# Patient Record
Sex: Female | Born: 1965 | Race: White | Hispanic: No | Marital: Single | Smoking: Never smoker
Health system: Southern US, Community
[De-identification: ages and names within clinical notes are randomized; demographics above are authoritative.]

## PROBLEM LIST (undated history)

## (undated) DIAGNOSIS — E663 Overweight: Secondary | ICD-10-CM

## (undated) DIAGNOSIS — F32A Depression, unspecified: Secondary | ICD-10-CM

## (undated) HISTORY — PX: FRACTURE SURGERY: SHX138

## (undated) HISTORY — DX: Depression, unspecified: F32.A

## (undated) HISTORY — PX: FEMUR FRACTURE SURGERY: SHX633

## (undated) HISTORY — DX: Overweight: E66.3

---

## 2000-08-13 ENCOUNTER — Other Ambulatory Visit: Admission: RE | Admit: 2000-08-13 | Discharge: 2000-08-13 | Payer: Self-pay

## 2002-08-10 ENCOUNTER — Emergency Department (HOSPITAL_COMMUNITY): Admission: EM | Admit: 2002-08-10 | Discharge: 2002-08-10 | Payer: Self-pay | Admitting: Emergency Medicine

## 2003-12-06 ENCOUNTER — Other Ambulatory Visit: Admission: RE | Admit: 2003-12-06 | Discharge: 2003-12-06 | Payer: Self-pay | Admitting: Family Medicine

## 2003-12-13 ENCOUNTER — Encounter: Admission: RE | Admit: 2003-12-13 | Discharge: 2003-12-13 | Payer: Self-pay | Admitting: Family Medicine

## 2004-02-15 ENCOUNTER — Encounter: Admission: RE | Admit: 2004-02-15 | Discharge: 2004-02-15 | Payer: Self-pay | Admitting: Family Medicine

## 2004-12-06 ENCOUNTER — Other Ambulatory Visit: Admission: RE | Admit: 2004-12-06 | Discharge: 2004-12-06 | Payer: Self-pay | Admitting: Family Medicine

## 2004-12-18 ENCOUNTER — Encounter: Admission: RE | Admit: 2004-12-18 | Discharge: 2004-12-18 | Payer: Self-pay | Admitting: Family Medicine

## 2005-04-10 ENCOUNTER — Emergency Department (HOSPITAL_COMMUNITY): Admission: EM | Admit: 2005-04-10 | Discharge: 2005-04-10 | Payer: Self-pay | Admitting: Emergency Medicine

## 2007-08-18 ENCOUNTER — Other Ambulatory Visit: Admission: RE | Admit: 2007-08-18 | Discharge: 2007-08-18 | Payer: Self-pay | Admitting: Family Medicine

## 2009-11-23 ENCOUNTER — Encounter: Admission: RE | Admit: 2009-11-23 | Discharge: 2009-11-23 | Payer: Self-pay | Admitting: Family Medicine

## 2010-10-16 ENCOUNTER — Other Ambulatory Visit
Admission: RE | Admit: 2010-10-16 | Discharge: 2010-10-16 | Payer: Self-pay | Source: Home / Self Care | Admitting: Family Medicine

## 2010-10-29 ENCOUNTER — Encounter: Payer: Self-pay | Admitting: Family Medicine
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
## 2012-07-30 ENCOUNTER — Other Ambulatory Visit: Payer: Self-pay | Admitting: Family Medicine

## 2012-07-30 DIAGNOSIS — Z1231 Encounter for screening mammogram for malignant neoplasm of breast: Secondary | ICD-10-CM

## 2012-09-02 ENCOUNTER — Ambulatory Visit
Admission: RE | Admit: 2012-09-02 | Discharge: 2012-09-02 | Disposition: A | Discharge status: Home / Self Care | Payer: Managed Care, Other (non HMO) | Source: Ambulatory Visit | Attending: Family Medicine | Admitting: Family Medicine

## 2012-09-02 DIAGNOSIS — Z1231 Encounter for screening mammogram for malignant neoplasm of breast: Secondary | ICD-10-CM

## 2012-09-12 ENCOUNTER — Other Ambulatory Visit: Payer: Self-pay | Admitting: Family Medicine

## 2012-09-12 DIAGNOSIS — R928 Other abnormal and inconclusive findings on diagnostic imaging of breast: Secondary | ICD-10-CM

## 2012-09-23 ENCOUNTER — Ambulatory Visit
Admission: RE | Admit: 2012-09-23 | Discharge: 2012-09-23 | Disposition: A | Discharge status: Home / Self Care | Payer: Managed Care, Other (non HMO) | Source: Ambulatory Visit | Attending: Family Medicine | Admitting: Family Medicine

## 2012-09-23 DIAGNOSIS — R928 Other abnormal and inconclusive findings on diagnostic imaging of breast: Secondary | ICD-10-CM

## 2013-07-29 ENCOUNTER — Other Ambulatory Visit: Payer: Self-pay

## 2013-07-29 DIAGNOSIS — Z1231 Encounter for screening mammogram for malignant neoplasm of breast: Secondary | ICD-10-CM

## 2013-09-04 ENCOUNTER — Ambulatory Visit
Admission: RE | Admit: 2013-09-04 | Discharge: 2013-09-04 | Disposition: A | Discharge status: Home / Self Care | Payer: Managed Care, Other (non HMO) | Source: Ambulatory Visit

## 2013-09-04 DIAGNOSIS — Z1231 Encounter for screening mammogram for malignant neoplasm of breast: Secondary | ICD-10-CM

## 2014-08-04 ENCOUNTER — Other Ambulatory Visit: Payer: Self-pay

## 2014-08-04 DIAGNOSIS — Z1231 Encounter for screening mammogram for malignant neoplasm of breast: Secondary | ICD-10-CM

## 2014-09-10 ENCOUNTER — Ambulatory Visit
Admission: RE | Admit: 2014-09-10 | Discharge: 2014-09-10 | Disposition: A | Discharge status: Home / Self Care | Payer: Managed Care, Other (non HMO) | Source: Ambulatory Visit

## 2014-09-10 DIAGNOSIS — Z1231 Encounter for screening mammogram for malignant neoplasm of breast: Secondary | ICD-10-CM

## 2014-09-15 ENCOUNTER — Other Ambulatory Visit: Payer: Self-pay | Admitting: Family Medicine

## 2014-09-15 DIAGNOSIS — R928 Other abnormal and inconclusive findings on diagnostic imaging of breast: Secondary | ICD-10-CM

## 2014-10-06 ENCOUNTER — Ambulatory Visit
Admission: RE | Admit: 2014-10-06 | Discharge: 2014-10-06 | Disposition: A | Discharge status: Home / Self Care | Payer: Managed Care, Other (non HMO) | Source: Ambulatory Visit | Attending: Family Medicine | Admitting: Family Medicine

## 2014-10-06 ENCOUNTER — Encounter (INDEPENDENT_AMBULATORY_CARE_PROVIDER_SITE_OTHER): Payer: Self-pay

## 2014-10-06 DIAGNOSIS — R928 Other abnormal and inconclusive findings on diagnostic imaging of breast: Secondary | ICD-10-CM

## 2015-11-03 ENCOUNTER — Other Ambulatory Visit: Payer: Self-pay

## 2015-11-03 DIAGNOSIS — Z1231 Encounter for screening mammogram for malignant neoplasm of breast: Secondary | ICD-10-CM

## 2015-11-18 ENCOUNTER — Other Ambulatory Visit: Payer: Self-pay | Admitting: Family Medicine

## 2015-11-18 ENCOUNTER — Other Ambulatory Visit (HOSPITAL_COMMUNITY)
Admission: RE | Admit: 2015-11-18 | Discharge: 2015-11-18 | Disposition: A | Discharge status: Home / Self Care | Payer: Managed Care, Other (non HMO) | Source: Ambulatory Visit | Attending: Family Medicine | Admitting: Family Medicine

## 2015-11-18 DIAGNOSIS — Z01419 Encounter for gynecological examination (general) (routine) without abnormal findings: Secondary | ICD-10-CM | POA: Diagnosis present

## 2015-11-18 DIAGNOSIS — Z8041 Family history of malignant neoplasm of ovary: Secondary | ICD-10-CM

## 2015-11-18 DIAGNOSIS — Z1151 Encounter for screening for human papillomavirus (HPV): Secondary | ICD-10-CM | POA: Diagnosis not present

## 2015-11-21 ENCOUNTER — Ambulatory Visit
Admission: RE | Admit: 2015-11-21 | Discharge: 2015-11-21 | Disposition: A | Discharge status: Home / Self Care | Payer: Managed Care, Other (non HMO) | Source: Ambulatory Visit

## 2015-11-21 DIAGNOSIS — Z1231 Encounter for screening mammogram for malignant neoplasm of breast: Secondary | ICD-10-CM

## 2015-11-22 LAB — CYTOLOGY - PAP

## 2016-12-28 ENCOUNTER — Other Ambulatory Visit: Payer: Self-pay | Admitting: Family Medicine

## 2016-12-28 DIAGNOSIS — Z8041 Family history of malignant neoplasm of ovary: Secondary | ICD-10-CM

## 2017-01-15 ENCOUNTER — Ambulatory Visit
Admission: RE | Admit: 2017-01-15 | Discharge: 2017-01-15 | Disposition: A | Discharge status: Home / Self Care | Payer: Managed Care, Other (non HMO) | Source: Ambulatory Visit | Attending: Family Medicine | Admitting: Family Medicine

## 2017-01-15 DIAGNOSIS — Z8041 Family history of malignant neoplasm of ovary: Secondary | ICD-10-CM

## 2018-01-13 ENCOUNTER — Other Ambulatory Visit: Payer: Self-pay | Admitting: Family Medicine

## 2018-01-13 DIAGNOSIS — Z1231 Encounter for screening mammogram for malignant neoplasm of breast: Secondary | ICD-10-CM

## 2018-02-04 ENCOUNTER — Ambulatory Visit
Admission: RE | Admit: 2018-02-04 | Discharge: 2018-02-04 | Disposition: A | Discharge status: Home / Self Care | Payer: Managed Care, Other (non HMO) | Source: Ambulatory Visit | Attending: Family Medicine | Admitting: Family Medicine

## 2018-02-04 DIAGNOSIS — Z1231 Encounter for screening mammogram for malignant neoplasm of breast: Secondary | ICD-10-CM

## 2020-01-07 ENCOUNTER — Ambulatory Visit: Payer: Self-pay | Attending: Internal Medicine

## 2020-01-07 DIAGNOSIS — Z23 Encounter for immunization: Secondary | ICD-10-CM

## 2020-01-07 NOTE — Progress Notes (Signed)
   Covid-19 Vaccination Clinic  Name:  [name]    MRN: [id number] DOB: Nov 02, 1965  01/07/2020  Ms. [name] was observed post Covid-19 immunization for 15 minutes without incident. She was provided with Vaccine Information Sheet and instruction to access the V-Safe system.   Ms. [name] was instructed to call 911 with any severe reactions post vaccine: Marland Kitchen Difficulty breathing  . Swelling of face and throat  . A fast heartbeat  . A bad rash all over body  . Dizziness and weakness   Immunizations Administered    Name Date Dose VIS Date Route   Pfizer COVID-19 Vaccine 01/07/2020  4:05 PM 0.3 mL 09/18/2019 Intramuscular   Manufacturer: [employer], [employer]   Lot: [id number]   NDC: [address]-8

## 2020-02-01 ENCOUNTER — Ambulatory Visit: Payer: Self-pay | Attending: Internal Medicine

## 2020-02-01 DIAGNOSIS — Z23 Encounter for immunization: Secondary | ICD-10-CM

## 2020-02-01 NOTE — Progress Notes (Signed)
   Covid-19 Vaccination Clinic  Name:  [name]    MRN: [id number] DOB: July 03, 1966  02/01/2020  Ms. [name] was observed post Covid-19 immunization for 15 minutes without incident. She was provided with Vaccine Information Sheet and instruction to access the V-Safe system.   Ms. [name] was instructed to call 911 with any severe reactions post vaccine: Marland Kitchen Difficulty breathing  . Swelling of face and throat  . A fast heartbeat  . A bad rash all over body  . Dizziness and weakness   Immunizations Administered    Name Date Dose VIS Date Route   Pfizer COVID-19 Vaccine 02/01/2020 10:37 AM 0.3 mL 12/02/2018 Intramuscular   Manufacturer: [employer], [employer]   Lot: [id number]   NDC: [address]-0

## 2020-08-15 ENCOUNTER — Telehealth (INDEPENDENT_AMBULATORY_CARE_PROVIDER_SITE_OTHER): Payer: Self-pay

## 2020-08-15 ENCOUNTER — Other Ambulatory Visit: Payer: Self-pay

## 2020-08-15 ENCOUNTER — Ambulatory Visit (INDEPENDENT_AMBULATORY_CARE_PROVIDER_SITE_OTHER): Payer: Managed Care, Other (non HMO) | Admitting: Family Medicine

## 2020-08-15 ENCOUNTER — Encounter (INDEPENDENT_AMBULATORY_CARE_PROVIDER_SITE_OTHER): Payer: Self-pay | Admitting: Family Medicine

## 2020-08-15 VITALS — BP 107/74 | HR 90 | Temp 98.2°F | Ht 68.0 in | Wt 201.0 lb

## 2020-08-15 DIAGNOSIS — R5383 Other fatigue: Secondary | ICD-10-CM

## 2020-08-15 DIAGNOSIS — Z8249 Family history of ischemic heart disease and other diseases of the circulatory system: Secondary | ICD-10-CM

## 2020-08-15 DIAGNOSIS — Z9189 Other specified personal risk factors, not elsewhere classified: Secondary | ICD-10-CM

## 2020-08-15 DIAGNOSIS — E669 Obesity, unspecified: Secondary | ICD-10-CM

## 2020-08-15 DIAGNOSIS — Z0289 Encounter for other administrative examinations: Secondary | ICD-10-CM

## 2020-08-15 DIAGNOSIS — R0602 Shortness of breath: Secondary | ICD-10-CM

## 2020-08-15 DIAGNOSIS — F3289 Other specified depressive episodes: Secondary | ICD-10-CM

## 2020-08-15 DIAGNOSIS — Z6830 Body mass index (BMI) 30.0-30.9, adult: Secondary | ICD-10-CM

## 2020-08-15 DIAGNOSIS — E66811 Obesity, class 1: Secondary | ICD-10-CM

## 2020-08-16 LAB — COMPREHENSIVE METABOLIC PANEL
ALT: 12 IU/L (ref 0–32)
AST: 16 IU/L (ref 0–40)
Albumin/Globulin Ratio: 1.6 (ref 1.2–2.2)
Albumin: 4.4 g/dL (ref 3.8–4.9)
Alkaline Phosphatase: 88 IU/L (ref 44–121)
BUN/Creatinine Ratio: 17 (ref 9–23)
BUN: 15 mg/dL (ref 6–24)
Bilirubin Total: 0.4 mg/dL (ref 0.0–1.2)
CO2: 24 mmol/L (ref 20–29)
Calcium: 9.4 mg/dL (ref 8.7–10.2)
Chloride: 104 mmol/L (ref 96–106)
Creatinine, Ser: 0.88 mg/dL (ref 0.57–1.00)
GFR calc Af Amer: 86 mL/min/{1.73_m2} (ref >59)
GFR calc non Af Amer: 74 mL/min/{1.73_m2} (ref >59)
Globulin, Total: 2.7 g/dL (ref 1.5–4.5)
Glucose: 96 mg/dL (ref 65–99)
Potassium: 4.3 mmol/L (ref 3.5–5.2)
Sodium: 139 mmol/L (ref 134–144)
Total Protein: 7.1 g/dL (ref 6.0–8.5)

## 2020-08-16 LAB — CBC WITH DIFFERENTIAL/PLATELET
Basophils Absolute: 0 10*3/uL (ref 0.0–0.2)
Basos: 1 %
EOS (ABSOLUTE): 0 10*3/uL (ref 0.0–0.4)
Eos: 0 %
Hematocrit: 43.9 % (ref 34.0–46.6)
Hemoglobin: 14.5 g/dL (ref 11.1–15.9)
Immature Grans (Abs): 0 10*3/uL (ref 0.0–0.1)
Immature Granulocytes: 0 %
Lymphocytes Absolute: 1.6 10*3/uL (ref 0.7–3.1)
Lymphs: 35 %
MCH: 30.5 pg (ref 26.6–33.0)
MCHC: 33 g/dL (ref 31.5–35.7)
MCV: 92 fL (ref 79–97)
Monocytes Absolute: 0.2 10*3/uL (ref 0.1–0.9)
Monocytes: 5 %
Neutrophils Absolute: 2.7 10*3/uL (ref 1.4–7.0)
Neutrophils: 59 %
Platelets: 219 10*3/uL (ref 150–450)
RBC: 4.76 x10E6/uL (ref 3.77–5.28)
RDW: 12.4 % (ref 11.7–15.4)
WBC: 4.7 10*3/uL (ref 3.4–10.8)

## 2020-08-16 LAB — LIPID PANEL WITH LDL/HDL RATIO
Cholesterol, Total: 187 mg/dL (ref 100–199)
HDL: 54 mg/dL (ref >39)
LDL Chol Calc (NIH): 114 mg/dL — ABNORMAL HIGH (ref 0–99)
LDL/HDL Ratio: 2.1 ratio (ref 0.0–3.2)
Triglycerides: 107 mg/dL (ref 0–149)
VLDL Cholesterol Cal: 19 mg/dL (ref 5–40)

## 2020-08-16 LAB — T4: T4, Total: 5.8 ug/dL (ref 4.5–12.0)

## 2020-08-16 LAB — VITAMIN B12: Vitamin B-12: 245 pg/mL (ref 232–1245)

## 2020-08-16 LAB — TSH: TSH: 1.17 u[IU]/mL (ref 0.450–4.500)

## 2020-08-16 LAB — VITAMIN D 25 HYDROXY (VIT D DEFICIENCY, FRACTURES): Vit D, 25-Hydroxy: 22.2 ng/mL — ABNORMAL LOW (ref 30.0–100.0)

## 2020-08-16 LAB — T3: T3, Total: 94 ng/dL (ref 71–180)

## 2020-08-16 LAB — INSULIN, RANDOM: INSULIN: 9.3 u[IU]/mL (ref 2.6–24.9)

## 2020-08-16 LAB — FOLATE: Folate: 4.4 ng/mL (ref >3.0)

## 2020-08-16 LAB — HEMOGLOBIN A1C
Est. average glucose Bld gHb Est-mCnc: 114 mg/dL
Hgb A1c MFr Bld: 5.6 % (ref 4.8–5.6)

## 2020-08-22 NOTE — Progress Notes (Signed)
Dear Tina Hazel, MD,   Thank you for referring [name] to our clinic. The following note includes my evaluation and treatment recommendations.  Chief Complaint:   OBESITY [name] (MR# [id number]) is a 54 y.o. female who presents for evaluation and treatment of obesity and related comorbidities. Current BMI is Body mass index is 30.56 kg/m. [name] has been struggling with her weight for many years and has been unsuccessful in either losing weight, maintaining weight loss, or reaching her healthy weight goal.  [name] is currently in the action stage of change and ready to dedicate time achieving and maintaining a healthier weight. [name] is interested in becoming our patient and working on intensive lifestyle modifications including (but not limited to) diet and exercise for weight loss.  [name]'s habits were reviewed today and are as follows: Her family eats meals together, she thinks her family will eat healthier with her, her desired weight loss is 26 lbs, she started gaining weight this year, her heaviest weight ever was 201 pounds, she has significant food cravings issues, she snacks frequently in the evenings, she skips meals frequently and she struggles with emotional eating.  Depression Screen [name]'s Food and Mood (modified PHQ-9) score was 2.  Depression screen [name] 2/9 08/15/2020  Decreased Interest 0  Down, Depressed, Hopeless 0  PHQ - 2 Score 0  Altered sleeping 0  Tired, decreased energy 1  Change in appetite 1  Feeling bad or failure about yourself  0  Trouble concentrating 0  Moving slowly or fidgety/restless 0  Suicidal thoughts 0  PHQ-9 Score 2  Difficult doing work/chores Not difficult at all   Subjective:   1. Other fatigue [name] admits to daytime somnolence and admits to waking up still tired. Patent has a history of symptoms of daytime fatigue. [name] generally gets 6 or 7 hours of sleep per night, and states that  she has generally restful sleep. Snoring is present. Apneic episodes are not present. Epworth Sleepiness Score is 5. EKG-ventricular trigeminy, no previous EKG to compare to.  2. SOB (shortness of breath) on exertion [name] notes increasing shortness of breath with exercising and seems to be worsening over time with weight gain. She notes getting out of breath sooner with activity than she used to. This has not gotten worse recently. [name] denies shortness of breath at rest or orthopnea.  3. Family history of coronary artery disease [name] has a family history of coronary artery disease. Her father died of myocardial infarction in his 17's.  4. Other depression [name] is on Effexor 75 mg PO daily. She denies suicidal or homicidal ideas. Her blood pressure is well controlled.  5. At risk for heart disease [name] is at a higher than average risk for cardiovascular disease due to obesity.   Assessment/Plan:   1. Other fatigue [name] does feel that her weight is causing her energy to be lower than it should be. Fatigue may be related to obesity, depression or many other causes. Labs will be ordered, and in the meanwhile, [name] will focus on self care including making healthy food choices, increasing physical activity and focusing on stress reduction.  - EKG 12-Lead - Vitamin B12 - CBC with Differential/Platelet - Comprehensive metabolic panel - Folate - Hemoglobin A1c - Insulin, random - Lipid Panel With LDL/HDL Ratio - T3 - T4 - TSH - Microalbumin / creatinine urine ratio - VITAMIN D 25 Hydroxy (Vit-D Deficiency, Fractures) - Ambulatory referral to Cardiology  2. SOB (shortness of breath)  on exertion [name] does feel that she gets out of breath more easily that she used to when she exercises. [name]'s shortness of breath appears to be obesity related and exercise induced. She has agreed to work on weight loss and gradually increase exercise to treat her  exercise induced shortness of breath. Will continue to monitor closely.  - Vitamin B12 - CBC with Differential/Platelet - Comprehensive metabolic panel - Folate - Hemoglobin A1c - Insulin, random - Lipid Panel With LDL/HDL Ratio - T3 - T4 - TSH - Microalbumin / creatinine urine ratio - VITAMIN D 25 Hydroxy (Vit-D Deficiency, Fractures) - Ambulatory referral to Cardiology  3. Family history of coronary artery disease We will refer to Cardiology for evaluation. [name] will follow up as directed.  - Ambulatory referral to Cardiology  4. Other depression Behavior modification techniques were discussed today to help [name] deal with her emotional/non-hunger eating behaviors. She is to follow up with her primary care physician. Orders and follow up as documented in patient record.   5. At risk for heart disease [name] was given approximately 15 minutes of coronary artery disease prevention counseling today. She is 54 y.o. female and has risk factors for heart disease including obesity. We discussed intensive lifestyle modifications today with an emphasis on specific weight loss instructions and strategies.   Repetitive spaced learning was employed today to elicit superior memory formation and behavioral change.  6. Class 1 obesity with serious comorbidity and body mass index (BMI) of 30.0 to 30.9 in adult, unspecified obesity type [name] is currently in the action stage of change and her goal is to continue with weight loss efforts. I recommend [name] begin the structured treatment plan as follows:  She has agreed to the Category 2 Plan + 100 calories.  Exercise goals: No exercise has been prescribed at this time.   Behavioral modification strategies: increasing lean protein intake, meal planning and cooking strategies, keeping healthy foods in the home and planning for success.  She was informed of the importance of frequent follow-up visits to maximize her success  with intensive lifestyle modifications for her multiple health conditions. She was informed we would discuss her lab results at her next visit unless there is a critical issue that needs to be addressed sooner. [name] agreed to keep her next visit at the agreed upon time to discuss these results.  Objective:   Blood pressure 107/74, pulse 90, temperature 98.2 F (36.8 C), temperature source Oral, height 5\' 8"  (1.727 m), weight 201 lb (91.2 kg), SpO2 99 %. Body mass index is 30.56 kg/m.  EKG: Normal sinus rhythm, rate 88 BPM.  Indirect Calorimeter completed today shows a VO2 of 222 and a REE of 1546.  Her calculated basal metabolic rate is thus her basal metabolic rate is worse than expected.  General: Cooperative, alert, well developed, in no acute distress. HEENT: Conjunctivae and lids unremarkable. Cardiovascular: Regular rhythm.  Lungs: Normal work of breathing. Neurologic: No focal deficits.   Lab Results  Component Value Date   CREATININE 0.88 08/15/2020   BUN 15 08/15/2020   NA 139 08/15/2020   K 4.3 08/15/2020   CL 104 08/15/2020   CO2 24 08/15/2020   Lab Results  Component Value Date   ALT 12 08/15/2020   AST 16 08/15/2020   ALKPHOS 88 08/15/2020   BILITOT 0.4 08/15/2020   Lab Results  Component Value Date   HGBA1C 5.6 08/15/2020   Lab Results  Component Value Date   INSULIN 9.3  08/15/2020   Lab Results  Component Value Date   TSH 1.170 08/15/2020   Lab Results  Component Value Date   CHOL 187 08/15/2020   HDL 54 08/15/2020   LDLCALC 114 (H) 08/15/2020   TRIG 107 08/15/2020   Lab Results  Component Value Date   WBC 4.7 08/15/2020   HGB 14.5 08/15/2020   HCT 43.9 08/15/2020   MCV 92 08/15/2020   PLT 219 08/15/2020   No results found for: IRON, TIBC, FERRITIN  Attestation Statements:   Reviewed by clinician on day of visit: allergies, medications, problem list, medical history, surgical history, family history, social history, and  previous encounter notes.   I, Burt Knack, am acting as transcriptionist for Reuben Likes, MD.  This is the patient's first visit at [facility]. The patient's NEW PATIENT PACKET was reviewed at length. Included in the packet: current and past health history, medications, allergies, ROS, gynecologic history (women only), surgical history, family history, social history, weight history, weight loss surgery history (for those that have had weight loss surgery), nutritional evaluation, mood and food questionnaire, PHQ9, Epworth questionnaire, sleep habits questionnaire, patient life and health improvement goals questionnaire. These will all be scanned into the patient's chart under media.   During the visit, I independently reviewed the patient's EKG, bioimpedance scale results, and indirect calorimeter results. I used this information to tailor a meal plan for the patient that will help her to lose weight and will improve her obesity-related conditions going forward. I performed a medically necessary appropriate examination and/or evaluation. I discussed the assessment and treatment plan with the patient. The patient was provided an opportunity to ask questions and all were answered. The patient agreed with the plan and demonstrated an understanding of the instructions. Labs were ordered at this visit and will be reviewed at the next visit unless more critical results need to be addressed immediately. Clinical information was updated and documented in the EMR.   Time spent on visit including pre-visit chart review and post-visit care was 50 minutes.   A separate 15 minutes was spent on risk counseling (see above).  I have reviewed the above documentation for accuracy and completeness, and I agree with the above. - Katherina Mires, MD

## 2020-08-29 ENCOUNTER — Ambulatory Visit (INDEPENDENT_AMBULATORY_CARE_PROVIDER_SITE_OTHER): Payer: Managed Care, Other (non HMO) | Admitting: Family Medicine

## 2020-08-29 ENCOUNTER — Other Ambulatory Visit: Payer: Self-pay

## 2020-08-29 ENCOUNTER — Encounter (INDEPENDENT_AMBULATORY_CARE_PROVIDER_SITE_OTHER): Payer: Self-pay | Admitting: Family Medicine

## 2020-08-29 VITALS — BP 119/74 | HR 75 | Temp 97.7°F | Ht 68.0 in | Wt 196.0 lb

## 2020-08-29 DIAGNOSIS — Z9189 Other specified personal risk factors, not elsewhere classified: Secondary | ICD-10-CM | POA: Diagnosis not present

## 2020-08-29 DIAGNOSIS — E559 Vitamin D deficiency, unspecified: Secondary | ICD-10-CM

## 2020-08-29 DIAGNOSIS — E7849 Other hyperlipidemia: Secondary | ICD-10-CM | POA: Diagnosis not present

## 2020-08-29 DIAGNOSIS — E8881 Metabolic syndrome: Secondary | ICD-10-CM | POA: Diagnosis not present

## 2020-08-29 DIAGNOSIS — E669 Obesity, unspecified: Secondary | ICD-10-CM | POA: Diagnosis not present

## 2020-08-29 DIAGNOSIS — Z6830 Body mass index (BMI) 30.0-30.9, adult: Secondary | ICD-10-CM

## 2020-08-29 MED ORDER — VITAMIN D (ERGOCALCIFEROL) 1.25 MG (50000 UNIT) PO CAPS: 50000.0000 [IU] | ORAL_CAPSULE | ORAL | 0 refills | Status: DC

## 2020-08-30 NOTE — Progress Notes (Signed)
Chief Complaint:   OBESITY [name] is here to discuss her progress with her obesity treatment plan along with follow-up of her obesity related diagnoses. [name] is on the Category 2 Plan + 100 calories and states she is following her eating plan approximately 95% of the time. [name] states she is doing 0 minutes 0 times per week.  Today's visit was #: 2 Starting weight: 201 lbs Starting date: 08/15/2020 Today's weight: 196 lbs Today's date: 08/29/2020 Total lbs lost to date: 5 Total lbs lost since last in-office visit: 5  Interim History: [name] felt the first 2 weeks wasn't too bad. She enjoys all Thanksgiving sides and food. She voices that for breakfast she was doing boiled eggs, and coffee.lunch was [place] or roast beef sandwich and apple for snack, dinner was salad with chicken on it or salmon with vegetables. She wasn't weighing protein at dinner. Snack was mini ice cream cone. Eating out for events to likely be the hardest obstacle in the next few weeks.  Subjective:   1. Other hyperlipidemia [name]'s last LDL was 114, HDL 54, and triglycerides 035. She is not on statin. Her 10 year ASCVD risk factor is 1.6%.   2. Vitamin D deficiency [name] denies nausea, vomiting, or muscle weakness, but she notes fatigue. She is not on Vit D supplementation.  3. Insulin resistance [name]'s last A1c was 5.6 and insulin 9.3. She has no prior labs.  4. At risk for diabetes mellitus [name] is at higher than average risk for developing diabetes due to obesity.   Assessment/Plan:   1. Other hyperlipidemia Cardiovascular risk and specific lipid/LDL goals reviewed. Lifestyle changes were encouraged. [name] will continue to work on diet, exercise and weight loss efforts. We will repeat labs in 3 months. Orders and follow up as documented in patient record.   Counseling Intensive lifestyle modifications are the first line treatment for this issue. . Dietary  changes: Increase soluble fiber. Decrease simple carbohydrates. . Exercise changes: Moderate to vigorous-intensity aerobic activity 150 minutes per week if tolerated. . Lipid-lowering medications: see documented in medical record.  2. Vitamin D deficiency Low Vitamin D level contributes to fatigue and are associated with obesity, breast, and colon cancer. Levon agreed to start prescription Vitamin D 50,000 IU every week with no refills. We will repeat labs in 3 months. She will follow-up for routine testing of Vitamin D, at least 2-3 times per year to avoid over-replacement.  - Vitamin D, Ergocalciferol, (DRISDOL) 1.25 MG (50000 UNIT) CAPS capsule; Take 1 capsule (50,000 Units total) by mouth every 7 (seven) days.  Dispense: 4 capsule; Refill: 0  3. Insulin resistance [name] will continue to work on weight loss, exercise, and decreasing simple carbohydrates to help decrease the risk of diabetes. We will repeat labs in 3 months, no medications at this time. [name] agreed to follow-up with [name] as directed to closely monitor her progress.  4. At risk for diabetes mellitus [name] was given approximately 30 minutes of diabetes education and counseling today. We discussed intensive lifestyle modifications today with an emphasis on weight loss as well as increasing exercise and decreasing simple carbohydrates in her diet. We also reviewed medication options with an emphasis on risk versus benefit of those discussed.   Repetitive spaced learning was employed today to elicit superior memory formation and behavioral change.  5. Class 1 obesity with serious comorbidity and body mass index (BMI) of 30.0 to 30.9 in adult, unspecified obesity type [name] is currently in the action  stage of change. As such, her goal is to continue with weight loss efforts. She has agreed to the Category 2 Plan.   Exercise goals: No exercise has been prescribed at this time.  Behavioral modification strategies:  increasing lean protein intake, meal planning and cooking strategies and holiday eating strategies .  [name] has agreed to follow-up with our clinic in 2 weeks. She was informed of the importance of frequent follow-up visits to maximize her success with intensive lifestyle modifications for her multiple health conditions.   Objective:   Blood pressure 119/74, pulse 75, temperature 97.7 F (36.5 C), temperature source Oral, height 5\' 8"  (1.727 m), weight 196 lb (88.9 kg), SpO2 98 %. Body mass index is 29.8 kg/m.  General: Cooperative, alert, well developed, in no acute distress. HEENT: Conjunctivae and lids unremarkable. Cardiovascular: Regular rhythm.  Lungs: Normal work of breathing. Neurologic: No focal deficits.   Lab Results  Component Value Date   CREATININE 0.88 08/15/2020   BUN 15 08/15/2020   NA 139 08/15/2020   K 4.3 08/15/2020   CL 104 08/15/2020   CO2 24 08/15/2020   Lab Results  Component Value Date   ALT 12 08/15/2020   AST 16 08/15/2020   ALKPHOS 88 08/15/2020   BILITOT 0.4 08/15/2020   Lab Results  Component Value Date   HGBA1C 5.6 08/15/2020   Lab Results  Component Value Date   INSULIN 9.3 08/15/2020   Lab Results  Component Value Date   TSH 1.170 08/15/2020   Lab Results  Component Value Date   CHOL 187 08/15/2020   HDL 54 08/15/2020   LDLCALC 114 (H) 08/15/2020   TRIG 107 08/15/2020   Lab Results  Component Value Date   WBC 4.7 08/15/2020   HGB 14.5 08/15/2020   HCT 43.9 08/15/2020   MCV 92 08/15/2020   PLT 219 08/15/2020   No results found for: IRON, TIBC, FERRITIN  Attestation Statements:   Reviewed by clinician on day of visit: allergies, medications, problem list, medical history, surgical history, family history, social history, and previous encounter notes.   I, 13/05/2020, am acting as transcriptionist for Burt Knack, MD.  I have reviewed the above documentation for accuracy and completeness, and I agree with  the above. - Reuben Likes, MD

## 2020-09-06 ENCOUNTER — Encounter: Payer: Self-pay | Admitting: *Deleted

## 2020-09-06 ENCOUNTER — Encounter: Payer: Self-pay | Admitting: Internal Medicine

## 2020-09-06 ENCOUNTER — Ambulatory Visit: Payer: Managed Care, Other (non HMO) | Admitting: Internal Medicine

## 2020-09-06 ENCOUNTER — Other Ambulatory Visit: Payer: Self-pay

## 2020-09-06 VITALS — BP 104/76 | HR 68 | Ht 68.0 in | Wt 203.0 lb

## 2020-09-06 DIAGNOSIS — I493 Ventricular premature depolarization: Secondary | ICD-10-CM | POA: Diagnosis not present

## 2020-09-06 DIAGNOSIS — E785 Hyperlipidemia, unspecified: Secondary | ICD-10-CM | POA: Diagnosis not present
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
## 2020-09-06 NOTE — Patient Instructions (Signed)
Medication Instructions:  Your physician recommends that you continue on your current medications as directed. Please refer to the Current Medication list given to you today.  *If you need a refill on your cardiac medications before your next appointment, please call your pharmacy*   Lab Work: None  If you have labs (blood work) drawn today and your tests are completely normal, you will receive your results only by: Marland Kitchen MyChart Message (if you have MyChart) OR . A paper copy in the mail If you have any lab test that is abnormal or we need to change your treatment, we will call you to review the results.   Testing/Procedures: Your physician has requested that you have an echocardiogram. Echocardiography is a painless test that uses sound waves to create images of your heart. It provides your doctor with information about the size and shape of your heart and how well your heart's chambers and valves are working. This procedure takes approximately one hour. There are no restrictions for this procedure.  Your physician has recommended that you wear a 14 day monitor. These monitors are medical devices that record the heart's electrical activity. Doctors most often use these monitors to diagnose arrhythmias. Arrhythmias are problems with the speed or rhythm of the heartbeat. The monitor is a small, portable device. You can wear one while you do your normal daily activities. This is usually used to diagnose what is causing palpitations/syncope (passing out).  Follow-Up: At [facility], you and your health needs are our priority.  As part of our continuing mission to provide you with exceptional heart care, we have created designated Provider Care Teams.  These Care Teams include your primary Cardiologist (physician) and Advanced Practice Providers (APPs -  Physician Assistants and Nurse Practitioners) who all work together to provide you with the care you need, when you need it.  We recommend  signing up for the patient portal called "MyChart".  Sign up information is provided on this After Visit Summary.  MyChart is used to connect with patients for Virtual Visits (Telemedicine).  Patients are able to view lab/test results, encounter notes, upcoming appointments, etc.  Non-urgent messages can be sent to your provider as well.   To learn more about what you can do with MyChart, go to [email or web address].    Your next appointment:   3 month(s)  The format for your next appointment:   In Person  Provider:   Riley Lam, MD   Other Instructions Christena Deem- Long Term Monitor Instructions   Your physician has requested you wear your ZIO patch monitor 14 days.   This is a single patch monitor.  [employer] supplies one patch monitor per enrollment.  Additional stickers are not available.   Please do not apply patch if you will be having a Nuclear Stress Test, Echocardiogram, Cardiac CT, MRI, or Chest Xray during the time frame you would be wearing the monitor. The patch cannot be worn during these tests.  You cannot remove and re-apply the ZIO XT patch monitor.   Your ZIO patch monitor will be sent USPS Priority mail from [facility] directly to your home address. The monitor may also be mailed to a PO BOX if home delivery is not available.   It may take 3-5 days to receive your monitor after you have been enrolled.   Once you have received you monitor, please review enclosed instructions.  Your monitor has already been registered assigning a specific monitor serial # to you.   Applying  the monitor   Shave hair from upper left chest.   Hold abrader disc by orange tab.  Rub abrader in 40 strokes over left upper chest as indicated in your monitor instructions.   Clean area with 4 enclosed alcohol pads .  Use all pads to assure are is cleaned thoroughly.  Let dry.   Apply patch as indicated in monitor instructions.  Patch will be place under collarbone on left side  of chest with arrow pointing upward.   Rub patch adhesive wings for 2 minutes.Remove white label marked "1".  Remove white label marked "2".  Rub patch adhesive wings for 2 additional minutes.   While looking in a mirror, press and release button in center of patch.  A small green light will flash 3-4 times .  This will be your only indicator the monitor has been turned on.     Do not shower for the first 24 hours.  You may shower after the first 24 hours.   Press button if you feel a symptom. You will hear a small click.  Record Date, Time and Symptom in the Patient Log Book.   When you are ready to remove patch, follow instructions on last 2 pages of Patient Log Book.  Stick patch monitor onto last page of Patient Log Book.   Place Patient Log Book in [place] box.  Use locking tab on box and tape box closed securely.  The [employer] and [employer] has [employer] on it.  Please place in mailbox as soon as possible.  Your physician should have your test results approximately 7 days after the monitor has been mailed back to [facility].   Call [facility] Customer Care at [phone] if you have questions regarding your ZIO XT patch monitor.  Call them immediately if you see an orange light blinking on your monitor.   If your monitor falls off in less than 4 days contact our Monitor department at [phone].  If your monitor becomes loose or falls off after 4 days call [employer] at [phone] for suggestions on securing your monitor.

## 2020-09-06 NOTE — Progress Notes (Signed)
Patient ID: [name], female   DOB: [birth year], 54 y.o.   MRN: [id number] Patient enrolled for [employer] to ship a 14 day ZIO XT long term holter monitor to her home.

## 2020-09-10 ENCOUNTER — Ambulatory Visit (INDEPENDENT_AMBULATORY_CARE_PROVIDER_SITE_OTHER): Payer: Managed Care, Other (non HMO)

## 2020-09-10 DIAGNOSIS — I493 Ventricular premature depolarization: Secondary | ICD-10-CM | POA: Diagnosis not present

## 2020-09-13 ENCOUNTER — Ambulatory Visit (INDEPENDENT_AMBULATORY_CARE_PROVIDER_SITE_OTHER): Payer: Managed Care, Other (non HMO) | Admitting: Adult Health

## 2020-09-13 ENCOUNTER — Other Ambulatory Visit: Payer: Self-pay

## 2020-09-13 ENCOUNTER — Encounter (INDEPENDENT_AMBULATORY_CARE_PROVIDER_SITE_OTHER): Payer: Self-pay | Admitting: Adult Health

## 2020-09-13 VITALS — BP 120/76 | HR 73 | Temp 97.6°F | Ht 68.0 in | Wt 197.0 lb

## 2020-09-13 DIAGNOSIS — Z9189 Other specified personal risk factors, not elsewhere classified: Secondary | ICD-10-CM

## 2020-09-13 DIAGNOSIS — E8881 Metabolic syndrome: Secondary | ICD-10-CM

## 2020-09-13 DIAGNOSIS — E669 Obesity, unspecified: Secondary | ICD-10-CM

## 2020-09-13 DIAGNOSIS — E559 Vitamin D deficiency, unspecified: Secondary | ICD-10-CM | POA: Diagnosis not present

## 2020-09-13 DIAGNOSIS — Z6830 Body mass index (BMI) 30.0-30.9, adult: Secondary | ICD-10-CM

## 2020-09-13 MED ORDER — VITAMIN D (ERGOCALCIFEROL) 1.25 MG (50000 UNIT) PO CAPS: 50000.0000 [IU] | ORAL_CAPSULE | ORAL | 0 refills | Status: DC

## 2020-09-13 NOTE — Progress Notes (Signed)
Chief Complaint:   OBESITY [name] is here to discuss her progress with her obesity treatment plan along with follow-up of her obesity related diagnoses. [name] is on the Category 2 Plan and states she is following her eating plan approximately 75% of the time. [name] states she is exercising 0 minutes 0 times per week.  Today's visit was #: 3 Starting weight: 201 lbs Starting date: 08/15/2020 Today's weight: 197 lbs Today's date: 09/13/2020 Total lbs lost to date: 4 Total lbs lost since last in-office visit: 0  Interim History: [name] has been able to follow the Category 2 meal plan quite easily for breakfast and lunch, but is challenged to stay on track when they eat out. She estimates to eat out dinner twice a week. She would like to increase regular exercise - she has been working in the yard and participating in the [employer].  Subjective:   Vitamin D deficiency. Vitamin D level on 08/15/2020 was 22.2, below goal of 50. [name] is on Ergocalciferol. No nausea, vomiting, or muscle weakness.    Ref. Range 08/15/2020 14:11  Vitamin D, 25-Hydroxy Latest Ref Range: 30.0 - 100.0 ng/mL 22.2 (L)   Insulin resistance. [name] has a diagnosis of insulin resistance based on her elevated fasting insulin level >5. She continues to work on diet and exercise to decrease her risk of diabetes. Insulin level on 08/15/2020 was 9.3 with normal blood glucose and A1c levels. She is not on metformin and denies polyphagia.  Lab Results  Component Value Date   INSULIN 9.3 08/15/2020   Lab Results  Component Value Date   HGBA1C 5.6 08/15/2020   At risk for osteoporosis. [name] is at higher risk of osteopenia and osteoporosis due to Vitamin D deficiency and obesity.   Assessment/Plan:   Vitamin D deficiency. Low Vitamin D level contributes to fatigue and are associated with obesity, breast, and colon cancer. She was given a refill on her Vitamin D,  Ergocalciferol, (DRISDOL) 1.25 MG (50000 UNIT) CAPS capsule every week #4 with 0 refills and will follow-up for routine testing of Vitamin D, at least 2-3 times per year to avoid over-replacement.   Insulin resistance. [name] will continue to work on weight loss, exercise, increasing protein, and decreasing simple carbohydrates to help decrease the risk of diabetes. [name] agreed to follow-up with [name] as directed to closely monitor her progress.  At risk for osteoporosis. [name] was given approximately 15 minutes of osteoporosis prevention counseling today. [name] is at risk for osteopenia and osteoporosis due to her Vitamin D deficiency. She was encouraged to take her Vitamin D and follow her higher calcium diet and increase strengthening exercise to help strengthen her bones and decrease her risk of osteopenia and osteoporosis.  Repetitive spaced learning was employed today to elicit superior memory formation and behavioral change.  Class 1 obesity with serious comorbidity and body mass index (BMI) of 30.0 to 30.9 in adult, unspecified obesity type.  [name] is currently in the action stage of change. As such, her goal is to continue with weight loss efforts. She has agreed to the Category 2 Plan and will journal 400-500 calories and 35 grams of protein at supper.   Exercise goals: [name] will do low impact Beach Body 2-3 times per week or will walk.  Behavioral modification strategies: increasing lean protein intake, meal planning and cooking strategies, planning for success and keeping a strict food journal.  [name] has agreed to follow-up with our clinic in 2 weeks.  She was informed of the importance of frequent follow-up visits to maximize her success with intensive lifestyle modifications for her multiple health conditions.   Objective:   Blood pressure 120/76, pulse 73, temperature 97.6 F (36.4 C), height 5\' 8"  (1.727 m), weight 197 lb (89.4 kg), SpO2 99 %. Body mass  index is 29.95 kg/m.  General: Cooperative, alert, well developed, in no acute distress. HEENT: Conjunctivae and lids unremarkable. Cardiovascular: Regular rhythm.  Lungs: Normal work of breathing. Neurologic: No focal deficits.   Lab Results  Component Value Date   CREATININE 0.88 08/15/2020   BUN 15 08/15/2020   NA 139 08/15/2020   K 4.3 08/15/2020   CL 104 08/15/2020   CO2 24 08/15/2020   Lab Results  Component Value Date   ALT 12 08/15/2020   AST 16 08/15/2020   ALKPHOS 88 08/15/2020   BILITOT 0.4 08/15/2020   Lab Results  Component Value Date   HGBA1C 5.6 08/15/2020   Lab Results  Component Value Date   INSULIN 9.3 08/15/2020   Lab Results  Component Value Date   TSH 1.170 08/15/2020   Lab Results  Component Value Date   CHOL 187 08/15/2020   HDL 54 08/15/2020   LDLCALC 114 (H) 08/15/2020   TRIG 107 08/15/2020   Lab Results  Component Value Date   WBC 4.7 08/15/2020   HGB 14.5 08/15/2020   HCT 43.9 08/15/2020   MCV 92 08/15/2020   PLT 219 08/15/2020   No results found for: IRON, TIBC, FERRITIN  Attestation Statements:   Reviewed by clinician on day of visit: allergies, medications, problem list, medical history, surgical history, family history, social history, and previous encounter notes.  I, 13/05/2020, am acting as Marianna [employer] for Energy manager, NP-C   I have reviewed the above documentation for accuracy and completeness, and I agree with the above. -  Katy d. Danford, NP-C

## 2020-09-15 DIAGNOSIS — Z6830 Body mass index (BMI) 30.0-30.9, adult: Secondary | ICD-10-CM | POA: Insufficient documentation

## 2020-09-15 DIAGNOSIS — E559 Vitamin D deficiency, unspecified: Secondary | ICD-10-CM | POA: Insufficient documentation

## 2020-09-15 DIAGNOSIS — E669 Obesity, unspecified: Secondary | ICD-10-CM | POA: Insufficient documentation

## 2020-09-15 DIAGNOSIS — E8881 Metabolic syndrome: Secondary | ICD-10-CM | POA: Insufficient documentation

## 2020-09-15 DIAGNOSIS — E66811 Obesity, class 1: Secondary | ICD-10-CM | POA: Insufficient documentation

## 2020-09-27 ENCOUNTER — Other Ambulatory Visit: Payer: Self-pay

## 2020-09-27 ENCOUNTER — Encounter (INDEPENDENT_AMBULATORY_CARE_PROVIDER_SITE_OTHER): Payer: Self-pay | Admitting: Bariatrics

## 2020-09-27 ENCOUNTER — Ambulatory Visit (INDEPENDENT_AMBULATORY_CARE_PROVIDER_SITE_OTHER): Payer: Managed Care, Other (non HMO) | Admitting: Bariatrics

## 2020-09-27 VITALS — BP 114/72 | HR 76 | Temp 97.8°F | Ht 68.0 in | Wt 195.0 lb

## 2020-09-27 DIAGNOSIS — Z6830 Body mass index (BMI) 30.0-30.9, adult: Secondary | ICD-10-CM

## 2020-09-27 DIAGNOSIS — E559 Vitamin D deficiency, unspecified: Secondary | ICD-10-CM | POA: Diagnosis not present

## 2020-09-27 DIAGNOSIS — E669 Obesity, unspecified: Secondary | ICD-10-CM | POA: Diagnosis not present

## 2020-09-27 DIAGNOSIS — E7849 Other hyperlipidemia: Secondary | ICD-10-CM

## 2020-09-27 DIAGNOSIS — E8881 Metabolic syndrome: Secondary | ICD-10-CM | POA: Diagnosis not present

## 2020-09-28 ENCOUNTER — Encounter (INDEPENDENT_AMBULATORY_CARE_PROVIDER_SITE_OTHER): Payer: Self-pay | Admitting: Bariatrics

## 2020-09-28 NOTE — Progress Notes (Signed)
Chief Complaint:   OBESITY Cassandre Oleksy is here to discuss her progress with her obesity treatment plan along with follow-up of her obesity related diagnoses. Cherryl is on the Category 2 Plan and states she is following her eating plan approximately 90% of the time. Anzlee states she is exercising 0 minutes 0 times per week.  Today's visit was #: 4 Starting weight: 201 lbs Starting date: 08/15/2020 Today's weight: 195 lbs Today's date: 09/27/2020 Total lbs lost to date: 6 Total lbs lost since last in-office visit: 2  Interim History: Leiloni is down 2 lbs since her last visit. She reports doing okay with her water and protein intake.  Subjective:   Other hyperlipidemia. Dulcy is on no medication.  Lab Results  Component Value Date   CHOL 187 08/15/2020   HDL 54 08/15/2020   LDLCALC 114 (H) 08/15/2020   TRIG 107 08/15/2020   Lab Results  Component Value Date   ALT 12 08/15/2020   AST 16 08/15/2020   ALKPHOS 88 08/15/2020   BILITOT 0.4 08/15/2020   The 10-year ASCVD risk score Denman George DC Jr., et al., 2013) is: 1.5%   Values used to calculate the score:     Age: 89 years     Sex: Female     Is Non-Hispanic African American: No     Diabetic: No     Tobacco smoker: No     Systolic Blood Pressure: 114 mmHg     Is BP treated: No     HDL Cholesterol: 54 mg/dL     Total Cholesterol: 187 mg/dL  Insulin resistance. Aeron has a diagnosis of insulin resistance based on her elevated fasting insulin level >5. She continues to work on diet and exercise to decrease her risk of diabetes. Rayley is on no medication and denies polyphagia.  Lab Results  Component Value Date   INSULIN 9.3 08/15/2020   Lab Results  Component Value Date   HGBA1C 5.6 08/15/2020   Vitamin D deficiency. Shaquasha is taking high dose Vitamin D supplementation.    Ref. Range 08/15/2020 14:11  Vitamin D, 25-Hydroxy Latest Ref Range: 30.0 - 100.0 ng/mL 22.2 (L)    Assessment/Plan:   Other hyperlipidemia. Cardiovascular risk and specific lipid/LDL goals reviewed.  We discussed several lifestyle modifications today and Banesa will continue to work on diet, exercise and weight loss efforts. Orders and follow up as documented in patient record. She will avoid trans fats, limit saturated fats, and increase PUFA's and MUFA's.  Counseling Intensive lifestyle modifications are the first line treatment for this issue. . Dietary changes: Increase soluble fiber. Decrease simple carbohydrates. . Exercise changes: Moderate to vigorous-intensity aerobic activity 150 minutes per week if tolerated. . Lipid-lowering medications: see documented in medical record.  Insulin resistance. Shannah will continue to work on weight loss, exercise, increasing healthy fats and protein, and decreasing refined carbohydrates to help decrease the risk of diabetes. Cleda agreed to follow-up with [name] as directed to closely monitor her progress.  Vitamin D deficiency. Low Vitamin D level contributes to fatigue and are associated with obesity, breast, and colon cancer. She agrees to continue to take high dose Vitamin D as directed and will follow-up for routine testing of Vitamin D, at least 2-3 times per year to avoid over-replacement.  Class 1 obesity with serious comorbidity and body mass index (BMI) of 30.0 to 30.9 in adult, unspecified obesity type - starting BMI > 30.  Madaline is currently in the action stage of change.  As such, her goal is to continue with weight loss efforts. She has agreed to the Category 3 Plan.   She will work on meal planning and intentional eating.   Handout was provided on [employer].  Exercise goals: Brookelin will walk in the neighborhood and do "[employer]."  Behavioral modification strategies: increasing lean protein intake, decreasing simple carbohydrates, increasing vegetables, increasing water intake, decreasing eating out, no skipping  meals, meal planning and cooking strategies, keeping healthy foods in the home and planning for success.  Adalynn has agreed to follow-up with our clinic in 2-3 weeks. She was informed of the importance of frequent follow-up visits to maximize her success with intensive lifestyle modifications for her multiple health conditions.   Objective:   Blood pressure 114/72, pulse 76, temperature 97.8 F (36.6 C), temperature source Oral, height 5\' 8"  (1.727 m), weight 195 lb (88.5 kg), SpO2 98 %. Body mass index is 29.65 kg/m.  General: Cooperative, alert, well developed, in no acute distress. HEENT: Conjunctivae and lids unremarkable. Cardiovascular: Regular rhythm.  Lungs: Normal work of breathing. Neurologic: No focal deficits.   Lab Results  Component Value Date   CREATININE 0.88 08/15/2020   BUN 15 08/15/2020   NA 139 08/15/2020   K 4.3 08/15/2020   CL 104 08/15/2020   CO2 24 08/15/2020   Lab Results  Component Value Date   ALT 12 08/15/2020   AST 16 08/15/2020   ALKPHOS 88 08/15/2020   BILITOT 0.4 08/15/2020   Lab Results  Component Value Date   HGBA1C 5.6 08/15/2020   Lab Results  Component Value Date   INSULIN 9.3 08/15/2020   Lab Results  Component Value Date   TSH 1.170 08/15/2020   Lab Results  Component Value Date   CHOL 187 08/15/2020   HDL 54 08/15/2020   LDLCALC 114 (H) 08/15/2020   TRIG 107 08/15/2020   Lab Results  Component Value Date   WBC 4.7 08/15/2020   HGB 14.5 08/15/2020   HCT 43.9 08/15/2020   MCV 92 08/15/2020   PLT 219 08/15/2020   No results found for: IRON, TIBC, FERRITIN  Attestation Statements:   Reviewed by clinician on day of visit: allergies, medications, problem list, medical history, surgical history, family history, social history, and previous encounter notes.  Time spent on visit including pre-visit chart review and post-visit charting and care was 20 minutes.   13/05/2020, am acting as Fernanda Drum for Energy manager, DO   I have reviewed the above documentation for accuracy and completeness, and I agree with the above. [employer], DO

## 2020-10-04 ENCOUNTER — Other Ambulatory Visit (HOSPITAL_COMMUNITY): Payer: Managed Care, Other (non HMO)

## 2020-10-10 ENCOUNTER — Telehealth: Payer: Self-pay

## 2020-10-10 NOTE — Telephone Encounter (Signed)
-----   Message from Christell Constant, MD sent at 10/10/2020 10:38 AM EST ----- Results: Occasional PVCs Plan: Pending echo; continue current plan unless new CP  Christell Constant, MD

## 2020-10-10 NOTE — Telephone Encounter (Signed)
Left the patient a message to call the office back regarding her recent monitor results.

## 2020-10-13 NOTE — Telephone Encounter (Signed)
The patient has been notified of the result and verbalized understanding.  All questions (if any) were answered. Sampson Goon, RN 10/13/2020 5:01 PM

## 2020-10-14 ENCOUNTER — Encounter (INDEPENDENT_AMBULATORY_CARE_PROVIDER_SITE_OTHER): Payer: Self-pay | Admitting: Adult Health

## 2020-10-15 ENCOUNTER — Other Ambulatory Visit (INDEPENDENT_AMBULATORY_CARE_PROVIDER_SITE_OTHER): Payer: Self-pay | Admitting: Adult Health

## 2020-10-15 DIAGNOSIS — E559 Vitamin D deficiency, unspecified: Secondary | ICD-10-CM

## 2020-10-18 ENCOUNTER — Ambulatory Visit (INDEPENDENT_AMBULATORY_CARE_PROVIDER_SITE_OTHER): Payer: Managed Care, Other (non HMO) | Admitting: Adult Health

## 2020-10-19 ENCOUNTER — Ambulatory Visit (INDEPENDENT_AMBULATORY_CARE_PROVIDER_SITE_OTHER): Payer: Managed Care, Other (non HMO) | Admitting: Family Medicine

## 2020-10-19 ENCOUNTER — Encounter (INDEPENDENT_AMBULATORY_CARE_PROVIDER_SITE_OTHER): Payer: Self-pay | Admitting: Family Medicine

## 2020-10-19 ENCOUNTER — Other Ambulatory Visit: Payer: Self-pay

## 2020-10-19 VITALS — BP 105/71 | HR 87 | Temp 97.6°F | Ht 68.0 in | Wt 196.0 lb

## 2020-10-19 DIAGNOSIS — E559 Vitamin D deficiency, unspecified: Secondary | ICD-10-CM | POA: Diagnosis not present

## 2020-10-19 DIAGNOSIS — Z9189 Other specified personal risk factors, not elsewhere classified: Secondary | ICD-10-CM

## 2020-10-19 DIAGNOSIS — E8881 Metabolic syndrome: Secondary | ICD-10-CM

## 2020-10-19 DIAGNOSIS — Z6830 Body mass index (BMI) 30.0-30.9, adult: Secondary | ICD-10-CM | POA: Diagnosis not present

## 2020-10-19 DIAGNOSIS — E669 Obesity, unspecified: Secondary | ICD-10-CM | POA: Diagnosis not present

## 2020-10-19 MED ORDER — VITAMIN D (ERGOCALCIFEROL) 1.25 MG (50000 UNIT) PO CAPS: 50000.0000 [IU] | ORAL_CAPSULE | ORAL | 0 refills | Status: DC

## 2020-10-24 NOTE — Progress Notes (Signed)
Chief Complaint:   OBESITY [name] is here to discuss her progress with her obesity treatment plan along with follow-up of her obesity related diagnoses. [name] is on the Category 2 Plan and states she is following her eating plan approximately 85% of the time. 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  Today's visit was #: 5 Starting weight: 201 lbs Starting date: 08/15/2020 Today's weight: 196 lbs Today's date: 10/19/2020 Total lbs lost to date: 5 lbs Total lbs lost since last in-office visit: 0  Interim History: [name] had a few indulgences over the holidays. Besides the meals, patient did notice she was snacking more. Snacking is still an issue especially in afternoon before dinner. She has no plans for upcoming weeks.  Subjective:   1. Vitamin D deficiency [name] is on prescription Vitamin D. She denies nausea, vomiting, or muscle weakness, but notes fatigue.  2. Insulin resistance [name]'s last A1c was 5.6 and insulin 9.3. She is having carb cravings.  3. At risk for osteoporosis [name] is at higher risk of osteopenia and osteoporosis due to Vitamin D deficiency.     Assessment/Plan:   1. Vitamin D deficiency We will refill Vitamin D 50,000 unit weekly #4. No refill.  - Vitamin D, Ergocalciferol, (DRISDOL) 1.25 MG (50000 UNIT) CAPS capsule; Take 1 capsule (50,000 Units total) by mouth every 7 (seven) days.  Dispense: 4 capsule; Refill: 0  2. Insulin resistance Will repeat labs in March.  3. At risk for osteoporosis [name] was given approximately 15 minutes of osteoporosis prevention counseling today. [name] is at risk for osteopenia and osteoporosis due to her Vitamin D deficiency. She was encouraged to take her Vitamin D and follow her higher calcium diet and increase strengthening exercise to help strengthen her bones and decrease her risk of osteopenia and osteoporosis.  Repetitive spaced learning was employed today to elicit  superior memory formation and behavioral change.  4. Class 1 obesity with serious comorbidity and body mass index (BMI) of 30.0 to 30.9 in adult, unspecified obesity type  [name] is currently in the action stage of change. As such, her goal is to continue with weight loss efforts. She has agreed to the Category 2 Plan with 8 oz at dinner.   Exercise goals: No exercise has been prescribed at this time.  Behavioral modification strategies: increasing lean protein intake, meal planning and cooking strategies, keeping healthy foods in the home and planning for success.  [name] has agreed to follow-up with our clinic in 2 weeks. She was informed of the importance of frequent follow-up visits to maximize her success with intensive lifestyle modifications for her multiple health conditions.     Objective:   Blood pressure 105/71, pulse 87, temperature 97.6 F (36.4 C), temperature source Oral, height 5\' 8"  (1.727 m), weight 196 lb (88.9 kg), SpO2 99 %. Body mass index is 29.8 kg/m.  General: Cooperative, alert, well developed, in no acute distress. HEENT: Conjunctivae and lids unremarkable. Cardiovascular: Regular rhythm.  Lungs: Normal work of breathing. Neurologic: No focal deficits.   Lab Results  Component Value Date   CREATININE 0.88 08/15/2020   BUN 15 08/15/2020   NA 139 08/15/2020   K 4.3 08/15/2020   CL 104 08/15/2020   CO2 24 08/15/2020   Lab Results  Component Value Date   ALT 12 08/15/2020   AST 16 08/15/2020   ALKPHOS 88 08/15/2020   BILITOT 0.4 08/15/2020   Lab Results  Component Value Date   HGBA1C  5.6 08/15/2020   Lab Results  Component Value Date   INSULIN 9.3 08/15/2020   Lab Results  Component Value Date   TSH 1.170 08/15/2020   Lab Results  Component Value Date   CHOL 187 08/15/2020   HDL 54 08/15/2020   LDLCALC 114 (H) 08/15/2020   TRIG 107 08/15/2020   Lab Results  Component Value Date   WBC 4.7 08/15/2020   HGB 14.5 08/15/2020    HCT 43.9 08/15/2020   MCV 92 08/15/2020   PLT 219 08/15/2020   No results found for: IRON, TIBC, FERRITIN    Attestation Statements:   Reviewed by clinician on day of visit: allergies, medications, problem list, medical history, surgical history, family history, social history, and previous encounter notes.   I, Delorse Limber, am acting as transcriptionist for Reuben Likes, MD.  I have reviewed the above documentation for accuracy and completeness, and I agree with the above. - Katherina Mires, MD

## 2020-11-01 ENCOUNTER — Other Ambulatory Visit: Payer: Self-pay

## 2020-11-01 ENCOUNTER — Ambulatory Visit (HOSPITAL_COMMUNITY): Payer: Managed Care, Other (non HMO) | Attending: Internal Medicine

## 2020-11-01 DIAGNOSIS — I493 Ventricular premature depolarization: Secondary | ICD-10-CM | POA: Insufficient documentation

## 2020-11-01 LAB — ECHOCARDIOGRAM COMPLETE
Area-P 1/2: 2.88 cm2
S' Lateral: 2.7 cm

## 2020-11-03 ENCOUNTER — Other Ambulatory Visit: Payer: Self-pay

## 2020-11-03 ENCOUNTER — Ambulatory Visit (INDEPENDENT_AMBULATORY_CARE_PROVIDER_SITE_OTHER): Payer: Managed Care, Other (non HMO) | Admitting: Family Medicine

## 2020-11-03 ENCOUNTER — Encounter (INDEPENDENT_AMBULATORY_CARE_PROVIDER_SITE_OTHER): Payer: Self-pay | Admitting: Family Medicine

## 2020-11-03 VITALS — BP 102/71 | HR 85 | Temp 97.9°F | Ht 68.0 in | Wt 197.0 lb

## 2020-11-03 DIAGNOSIS — Z6830 Body mass index (BMI) 30.0-30.9, adult: Secondary | ICD-10-CM

## 2020-11-03 DIAGNOSIS — E669 Obesity, unspecified: Secondary | ICD-10-CM

## 2020-11-03 DIAGNOSIS — E559 Vitamin D deficiency, unspecified: Secondary | ICD-10-CM

## 2020-11-03 DIAGNOSIS — Z9189 Other specified personal risk factors, not elsewhere classified: Secondary | ICD-10-CM

## 2020-11-03 DIAGNOSIS — L2089 Other atopic dermatitis: Secondary | ICD-10-CM | POA: Diagnosis not present

## 2020-11-03 MED ORDER — DESONIDE 0.05 % EX OINT: 1.0000 "application " | TOPICAL_OINTMENT | Freq: Two times a day (BID) | CUTANEOUS | 0 refills | Status: AC
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
## 2020-11-17 ENCOUNTER — Other Ambulatory Visit: Payer: Self-pay

## 2020-11-17 ENCOUNTER — Ambulatory Visit (INDEPENDENT_AMBULATORY_CARE_PROVIDER_SITE_OTHER): Payer: Managed Care, Other (non HMO) | Admitting: Family Medicine

## 2020-11-17 ENCOUNTER — Encounter (INDEPENDENT_AMBULATORY_CARE_PROVIDER_SITE_OTHER): Payer: Self-pay | Admitting: Family Medicine

## 2020-11-17 VITALS — BP 107/74 | HR 81 | Temp 98.0°F | Ht 68.0 in | Wt 196.0 lb

## 2020-11-17 DIAGNOSIS — E559 Vitamin D deficiency, unspecified: Secondary | ICD-10-CM

## 2020-11-17 DIAGNOSIS — E8881 Metabolic syndrome: Secondary | ICD-10-CM

## 2020-11-17 DIAGNOSIS — Z6830 Body mass index (BMI) 30.0-30.9, adult: Secondary | ICD-10-CM

## 2020-11-17 DIAGNOSIS — Z9189 Other specified personal risk factors, not elsewhere classified: Secondary | ICD-10-CM | POA: Diagnosis not present

## 2020-11-17 DIAGNOSIS — E669 Obesity, unspecified: Secondary | ICD-10-CM | POA: Diagnosis not present

## 2020-11-17 MED ORDER — VITAMIN D (ERGOCALCIFEROL) 1.25 MG (50000 UNIT) PO CAPS: 50000.0000 [IU] | ORAL_CAPSULE | ORAL | 0 refills | Status: DC

## 2020-11-17 MED ORDER — METFORMIN HCL 500 MG PO TABS
500.0000 mg | ORAL_TABLET | Freq: Every day | ORAL | 0 refills | Status: DC
Start: 2020-11-17 — End: 2020-12-08

## 2020-11-21 NOTE — Progress Notes (Signed)
Chief Complaint:   OBESITY [name] is here to discuss her progress with her obesity treatment plan along with follow-up of her obesity related diagnoses. [name] is on the Category 2 Plan and states she is following her eating plan approximately 90-92% of the time. [name] states she is doing more walking.  Today's visit was #: 7 Starting weight: 201 lbs Starting date: 08/15/2020 Today's weight: 196 lbs Today's date: 11/17/2020 Total lbs lost to date: 5 lbs Total lbs lost since last in-office visit: 0  Interim History: Pt returned to clinic and has been trying to get a jump start on walking more. Within the next few weeks, pt has a birthday party to attend. She denies hunger but has occasional cravings for sweets. She is satisfied with a small ice cream cone from [employer].  Subjective:   1. Insulin resistance Pt's last insulin level 9.3 and A1c 5.6. She is interested in meds to help with jumpstart.   Lab Results  Component Value Date   INSULIN 9.3 08/15/2020   Lab Results  Component Value Date   HGBA1C 5.6 08/15/2020    2. Vitamin D deficiency Pt denies nausea, vomiting, and muscle weakness but notes fatigue. Pt is on prescription Vit D. Her last Vit D level was 22.2.  3. At risk for diabetes mellitus [name] is at higher than average risk for developing diabetes due to obesity.   Assessment/Plan:   1. Insulin resistance [name] will continue to work on weight loss, exercise, and decreasing simple carbohydrates to help decrease the risk of diabetes. [name] agreed to follow-up with [name] as directed to closely monitor her progress. Start Metformin 500 mg, as per below.  - metFORMIN (GLUCOPHAGE) 500 MG tablet; Take 1 tablet (500 mg total) by mouth daily with breakfast.  Dispense: 30 tablet; Refill: 0  2. Vitamin D deficiency Low Vitamin D level contributes to fatigue and are associated with obesity, breast, and colon cancer. She agrees to continue to take  prescription Vitamin D @50 ,000 IU every week and will follow-up for routine testing of Vitamin D, at least 2-3 times per year to avoid over-replacement.  - Vitamin D, Ergocalciferol, (DRISDOL) 1.25 MG (50000 UNIT) CAPS capsule; Take 1 capsule (50,000 Units total) by mouth every 7 (seven) days.  Dispense: 4 capsule; Refill: 0  3. At risk for diabetes mellitus [name] was given approximately 15 minutes of diabetes education and counseling today. We discussed intensive lifestyle modifications today with an emphasis on weight loss as well as increasing exercise and decreasing simple carbohydrates in her diet. We also reviewed medication options with an emphasis on risk versus benefit of those discussed.   Repetitive spaced learning was employed today to elicit superior memory formation and behavioral change.  4. Class 1 obesity with serious comorbidity and body mass index (BMI) of 30.0 to 30.9 in adult, unspecified obesity type [name] is currently in the action stage of change. As such, her goal is to continue with weight loss efforts. She has agreed to the Category 2 Plan.   Exercise goals: No exercise has been prescribed at this time.  Behavioral modification strategies: increasing lean protein intake, meal planning and cooking strategies, keeping healthy foods in the home and planning for success.  [name] has agreed to follow-up with our clinic in 2-3 weeks. She was informed of the importance of frequent follow-up visits to maximize her success with intensive lifestyle modifications for her multiple health conditions.   Objective:   Blood pressure 107/74, pulse  81, temperature 98 F (36.7 C), temperature source Oral, height 5\' 8"  (1.727 m), weight 196 lb (88.9 kg), SpO2 99 %. Body mass index is 29.8 kg/m.  General: Cooperative, alert, well developed, in no acute distress. HEENT: Conjunctivae and lids unremarkable. Cardiovascular: Regular rhythm.  Lungs: Normal work of  breathing. Neurologic: No focal deficits.   Lab Results  Component Value Date   CREATININE 0.88 08/15/2020   BUN 15 08/15/2020   NA 139 08/15/2020   K 4.3 08/15/2020   CL 104 08/15/2020   CO2 24 08/15/2020   Lab Results  Component Value Date   ALT 12 08/15/2020   AST 16 08/15/2020   ALKPHOS 88 08/15/2020   BILITOT 0.4 08/15/2020   Lab Results  Component Value Date   HGBA1C 5.6 08/15/2020   Lab Results  Component Value Date   INSULIN 9.3 08/15/2020   Lab Results  Component Value Date   TSH 1.170 08/15/2020   Lab Results  Component Value Date   CHOL 187 08/15/2020   HDL 54 08/15/2020   LDLCALC 114 (H) 08/15/2020   TRIG 107 08/15/2020   Lab Results  Component Value Date   WBC 4.7 08/15/2020   HGB 14.5 08/15/2020   HCT 43.9 08/15/2020   MCV 92 08/15/2020   PLT 219 08/15/2020    Attestation Statements:   Reviewed by clinician on day of visit: allergies, medications, problem list, medical history, surgical history, family history, social history, and previous encounter notes.  13/05/2020, am acting as transcriptionist for Edmund Hilda, MD.   I have reviewed the above documentation for accuracy and completeness, and I agree with the above. - Reuben Likes, MD

## 2020-12-06 ENCOUNTER — Other Ambulatory Visit: Payer: Self-pay

## 2020-12-06 ENCOUNTER — Encounter: Payer: Self-pay | Admitting: Internal Medicine

## 2020-12-06 ENCOUNTER — Ambulatory Visit: Payer: Managed Care, Other (non HMO) | Admitting: Internal Medicine

## 2020-12-06 VITALS — BP 96/68 | HR 91 | Ht 68.0 in | Wt 202.6 lb

## 2020-12-06 DIAGNOSIS — R5383 Other fatigue: Secondary | ICD-10-CM | POA: Diagnosis not present

## 2020-12-06 DIAGNOSIS — E785 Hyperlipidemia, unspecified: Secondary | ICD-10-CM

## 2020-12-06 DIAGNOSIS — I493 Ventricular premature depolarization: Secondary | ICD-10-CM

## 2020-12-06 DIAGNOSIS — Z8249 Family history of ischemic heart disease and other diseases of the circulatory system: Secondary | ICD-10-CM

## 2020-12-06 NOTE — Progress Notes (Signed)
Cardiology Office Note:    Date:  12/06/2020   ID:  [name], DOB 03/24/1966, MRN [id number]  PCP:  Sigmund Hazel, MD  [facility] Cardiologist:  Christell Constant, MD  [facility] Electrophysiologist:  None   CC: PVC follow up Consulted for the evaluation of shortness of breath at the behest of Sigmund Hazel, MD  History of Present Illness:    [name] is a 55 y.o. female with a hx of HLD, PVCs who presented with exercise intolerance 09/06/20.  In interim of this visit, patient had Echo and heart monitor- largely benign.  Seen 12/06/20  Patient notes that she is doing OK.  Since last visit notes no changes.  Relevant interval testing or therapy include Echo and Zio Patch.  There are no interval hospital/ED visit.    No chest pain or pressure.  No SOB/DOE and no PND/Orthopnea.  Notes that she is out of shape. No palpitations or syncope.   Past Medical History:  Diagnosis Date  . Depression   . Overweight     Past Surgical History:  Procedure Laterality Date  . FEMUR FRACTURE SURGERY Bilateral   . FRACTURE SURGERY Left     Current Medications: Current Meds  Medication Sig  . desonide (DESOWEN) 0.05 % ointment Apply 1 application topically 2 (two) times daily.  . metFORMIN (GLUCOPHAGE) 500 MG tablet Take 1 tablet (500 mg total) by mouth daily with breakfast.  . naproxen sodium (ALEVE) 220 MG tablet Take 1 tablet by mouth as needed.  . venlafaxine (EFFEXOR) 75 MG tablet Take 75 mg by mouth 2 (two) times daily.  . Vitamin D, Ergocalciferol, (DRISDOL) 1.25 MG (50000 UNIT) CAPS capsule Take 1 capsule (50,000 Units total) by mouth every 7 (seven) days.    Allergies:   Benadryl [diphenhydramine]   Social History   Socioeconomic History  . Marital status: Single    Spouse name: Not on file  . Number of children: Not on file  . Years of education: Not on file  . Highest education level: Not on file  Occupational History  . Occupation: Recruitment consultant  Tobacco Use  . Smoking status: Never Smoker  . Smokeless tobacco: Never Used  Substance and Sexual Activity  . Alcohol use: Not on file  . Drug use: Not on file  . Sexual activity: Not on file  Other Topics Concern  . Not on file  Social History Narrative  . Not on file   Social Determinants of Health   Financial Resource Strain: Not on file  Food Insecurity: Not on file  Transportation Needs: Not on file  Physical Activity: Not on file  Stress: Not on file  Social Connections: Not on file    Social: Has Kand[employer]itz 07; her issues the walking to the dealership led [name] here today.  Family History: The patient's family history includes Cancer in her mother; Heart disease in her father.  Father had MI in his 27s Grandfather had MI in the past.  ROS:   Please see the history of present illness.     All other systems reviewed and are negative.  EKGs/Labs/Other Studies Reviewed:    The following studies were reviewed today:  EKG:   09/06/20 SR rate 68 LAE, low voltage (precordial leads) non-specific ST/T changes; resolution of PVCs 08/15/20- SR 88 with frequent, trigeminal, monomorphic PVCs  Cardiac Event Monitoring: Date: 10/04/20 Results:  Patient had a minimum heart rate of 52 bpm, maximum heart rate of 151 bpm, and  average heart rate of 79 bpm.  Predominant underlying rhythm was sinus rhythm.  Three runs of supraventricular tachycardia occurred lasting 11 beats at longest with a max rate of 130 bpm.  Isolated PACs were rare (<1.0%), with rare couplets present.  Isolated PVCs were occasional (1.3%), with rare couplets, bigeminy, and trigeminy present.  No evidence of complete heart block .  Triggered and diary events associated with sinus rhythm.  No malignant arrhythmias.  Transthoracic Echocardiogram: Date:11/01/20 Results: 1. Left ventricular ejection fraction, by estimation, is 55 to 60%. The  left ventricle has normal function. The left  ventricle has no regional  wall motion abnormalities. Left ventricular diastolic parameters are  consistent with Grade I diastolic  dysfunction (impaired relaxation).  2. Right ventricular systolic function is low normal. The right  ventricular size is mildly enlarged. There is normal pulmonary artery  systolic pressure.  3. The mitral valve is grossly normal. No evidence of mitral valve  regurgitation.  4. The aortic valve is normal in structure. Aortic valve regurgitation is  not visualized. No aortic stenosis is present.  5. The inferior vena cava is normal in size with greater than 50%  respiratory variability, suggesting right atrial pressure of 3 mmHg.   Recent Labs: 08/15/2020: ALT 12; BUN 15; Creatinine, Ser 0.88; Hemoglobin 14.5; Platelets 219; Potassium 4.3; Sodium 139; TSH 1.170  Recent Lipid Panel    Component Value Date/Time   CHOL 187 08/15/2020 1411   TRIG 107 08/15/2020 1411   HDL 54 08/15/2020 1411   LDLCALC 114 (H) 08/15/2020 1411    Risk Assessment/Calculations:     The 10-year ASCVD risk score Denman George DC Montez Hageman., et al., 2013) is: 1.1%   Values used to calculate the score:     Age: 28 years     Sex: Female     Is Non-Hispanic African American: No     Diabetic: No     Tobacco smoker: No     Systolic Blood Pressure: 96 mmHg     Is BP treated: No     HDL Cholesterol: 54 mg/dL     Total Cholesterol: 187 mg/dL   Physical Exam:    VS:  BP 96/68   Pulse 91   Ht 5\' 8"  (1.727 m)   Wt 202 lb 9.6 oz (91.9 kg)   SpO2 94%   BMI 30.81 kg/m     Wt Readings from Last 3 Encounters:  12/06/20 202 lb 9.6 oz (91.9 kg)  11/17/20 196 lb (88.9 kg)  11/03/20 197 lb (89.4 kg)    GEN: Well nourished, well developed in no acute distress HEENT: Normal NECK: No JVD; No carotid bruits LYMPHATICS: No lymphadenopathy CARDIAC: RRR, no murmurs, rubs, gallops (no PVCs on exam) RESPIRATORY:  Clear to auscultation without rales, wheezing or rhonchi  ABDOMEN: Soft, non-tender,  non-distended MUSCULOSKELETAL:  No edema; No deformity  SKIN: Warm and dry NEUROLOGIC:  Alert and oriented x 3 PSYCHIATRIC:  Normal affect   ASSESSMENT:    No diagnosis found. PLAN:    In order of problems listed above:  PVCs - discussed AV nodal agents- will defer at this time given minimal symptoms and risk of fatigue on therapy  Fatigue Family history of coronary artery disease - discussed that patient would get out into the sun and back in her garden:  DOE or CP would likely push 11/05/20 to do CCTA +/- FFR given her family history  Hyperlipidemia  -LDL goal less than 100 - gave education on dietary changes  6-7 month follow up unless new symptoms or abnormal test results warranting change in plan  Would be reasonable for  Video Visit Follow up Would be reasonable for  APP Follow up    Medication Adjustments/Labs and Tests Ordered: Current medicines are reviewed at length with the patient today.  Concerns regarding medicines are outlined above.  No orders of the defined types were placed in this encounter.  No orders of the defined types were placed in this encounter.   There are no Patient Instructions on file for this visit.   Signed, Christell Constant, MD  12/06/2020 9:52 AM    [facility]

## 2020-12-06 NOTE — Patient Instructions (Addendum)
Medication Instructions:  Your physician recommends that you continue on your current medications as directed. Please refer to the Current Medication list given to you today.  *If you need a refill on your cardiac medications before your next appointment, please call your pharmacy*   Lab Work: NONE If you have labs (blood work) drawn today and your tests are completely normal, you will receive your results only by: Marland Kitchen MyChart Message (if you have MyChart) OR . A paper copy in the mail If you have any lab test that is abnormal or we need to change your treatment, we will call you to review the results.   Testing/Procedures: NONE   Follow-Up: At [facility], you and your health needs are our priority.  As part of our continuing mission to provide you with exceptional heart care, we have created designated Provider Care Teams.  These Care Teams include your primary Cardiologist (physician) and Advanced Practice Providers (APPs -  Physician Assistants and Nurse Practitioners) who all work together to provide you with the care you need, when you need it.  We recommend signing up for the patient portal called "MyChart".  Sign up information is provided on this After Visit Summary.  MyChart is used to connect with patients for Virtual Visits (Telemedicine).  Patients are able to view lab/test results, encounter notes, upcoming appointments, etc.  Non-urgent messages can be sent to your provider as well.   To learn more about what you can do with MyChart, go to [email or web address].    Your next appointment:   7 month(s)  The format for your next appointment:   In Person  Provider:   You may see Christell Constant, MD or one of the following Advanced Practice Providers on your designated Care Team:    Ronie Spies, PA-C  Jacolyn Reedy, PA-C

## 2020-12-07 ENCOUNTER — Other Ambulatory Visit (INDEPENDENT_AMBULATORY_CARE_PROVIDER_SITE_OTHER): Payer: Self-pay | Admitting: Family Medicine

## 2020-12-07 DIAGNOSIS — E8881 Metabolic syndrome: Secondary | ICD-10-CM

## 2020-12-07 NOTE — Telephone Encounter (Signed)
Dr.Ukleja 

## 2020-12-08 ENCOUNTER — Encounter (INDEPENDENT_AMBULATORY_CARE_PROVIDER_SITE_OTHER): Payer: Self-pay | Admitting: Family Medicine

## 2020-12-08 ENCOUNTER — Ambulatory Visit (INDEPENDENT_AMBULATORY_CARE_PROVIDER_SITE_OTHER): Payer: Managed Care, Other (non HMO) | Admitting: Family Medicine

## 2020-12-08 ENCOUNTER — Other Ambulatory Visit: Payer: Self-pay

## 2020-12-08 VITALS — BP 108/73 | HR 89 | Temp 98.0°F | Ht 68.0 in | Wt 194.0 lb

## 2020-12-08 DIAGNOSIS — Z6830 Body mass index (BMI) 30.0-30.9, adult: Secondary | ICD-10-CM

## 2020-12-08 DIAGNOSIS — E559 Vitamin D deficiency, unspecified: Secondary | ICD-10-CM | POA: Diagnosis not present

## 2020-12-08 DIAGNOSIS — Z9189 Other specified personal risk factors, not elsewhere classified: Secondary | ICD-10-CM | POA: Diagnosis not present

## 2020-12-08 DIAGNOSIS — E8881 Metabolic syndrome: Secondary | ICD-10-CM

## 2020-12-08 DIAGNOSIS — E669 Obesity, unspecified: Secondary | ICD-10-CM | POA: Diagnosis not present

## 2020-12-08 MED ORDER — METFORMIN HCL 500 MG PO TABS: 500.0000 mg | ORAL_TABLET | Freq: Every day | ORAL | 0 refills | Status: DC

## 2020-12-08 MED ORDER — VITAMIN D (ERGOCALCIFEROL) 1.25 MG (50000 UNIT) PO CAPS
50000.0000 [IU] | ORAL_CAPSULE | ORAL | 0 refills | Status: DC
Start: 2020-12-08 — End: 2020-12-29

## 2020-12-12 NOTE — Progress Notes (Signed)
Chief Complaint:   OBESITY [name] is here to discuss her progress with her obesity treatment plan along with follow-up of her obesity related diagnoses. [name] is on the Category 2 Plan and states she is following her eating plan approximately 90% of the time. [name] states she is walking 15-20 minutes 2 times per week.  Today's visit was #: 8 Starting weight: 201 lbs Starting date: 08/15/2020 Today's weight: 194 lbs Today's date: 12/08/2020 Total lbs lost to date: 7 lbs Total lbs lost since last in-office visit: 2 lbs  Interim History: [name] is doing fish/tuna and some fairlife protein shakes. She has been getting in most of her protein at dinner. Snacks have been apples and cheese. She denies cravings or hunger. She thinks the next few weeks will be increased red meat consumption.  Subjective:   1. Vitamin D deficiency Pt denies nausea, vomiting, and muscle weakness but notes fatigue. Pt is on prescription Vit D.  2. Insulin resistance [name]'s last A1c was 5.6 and insulin level 9.3. She is on Metformin with no GI side effects.  3. At risk for diabetes mellitus [name] is at higher than average risk for developing diabetes due to obesity.   Assessment/Plan:   1. Vitamin D deficiency Low Vitamin D level contributes to fatigue and are associated with obesity, breast, and colon cancer. She agrees to continue to take prescription Vitamin D @50 ,000 IU every week and will follow-up for routine testing of Vitamin D, at least 2-3 times per year to avoid over-replacement.  - Vitamin D, Ergocalciferol, (DRISDOL) 1.25 MG (50000 UNIT) CAPS capsule; Take 1 capsule (50,000 Units total) by mouth every 7 (seven) days.  Dispense: 4 capsule; Refill: 0  2. Insulin resistance [name] will continue to work on weight loss, exercise, and decreasing simple carbohydrates to help decrease the risk of diabetes. [name] agreed to follow-up with [name] as directed to closely monitor her  progress.  - metFORMIN (GLUCOPHAGE) 500 MG tablet; Take 1 tablet (500 mg total) by mouth daily with breakfast.  Dispense: 30 tablet; Refill: 0  3. At risk for diabetes mellitus [name] was given approximately 15 minutes of diabetes education and counseling today. We discussed intensive lifestyle modifications today with an emphasis on weight loss as well as increasing exercise and decreasing simple carbohydrates in her diet. We also reviewed medication options with an emphasis on risk versus benefit of those discussed.   Repetitive spaced learning was employed today to elicit superior memory formation and behavioral change.  4. Class 1 obesity with serious comorbidity and body mass index (BMI) of 30.0 to 30.9 in adult, unspecified obesity type [name] is currently in the action stage of change. As such, her goal is to continue with weight loss efforts. She has agreed to the Category 2 Plan + 100 calories.   Exercise goals: As is- more resistance training 10-15 minutes 2-3 times a week.  Behavioral modification strategies: increasing lean protein intake.  [name] has agreed to follow-up with our clinic in 3 weeks. She was informed of the importance of frequent follow-up visits to maximize her success with intensive lifestyle modifications for her multiple health conditions.   Objective:   Blood pressure 108/73, pulse 89, temperature 98 F (36.7 C), temperature source Oral, height 5\' 8"  (1.727 m), weight 194 lb (88 kg), SpO2 100 %. Body mass index is 29.5 kg/m.  General: Cooperative, alert, well developed, in no acute distress. HEENT: Conjunctivae and lids unremarkable. Cardiovascular: Regular rhythm.  Lungs: Normal work of  breathing. Neurologic: No focal deficits.   Lab Results  Component Value Date   CREATININE 0.88 08/15/2020   BUN 15 08/15/2020   NA 139 08/15/2020   K 4.3 08/15/2020   CL 104 08/15/2020   CO2 24 08/15/2020   Lab Results  Component Value Date   ALT 12  08/15/2020   AST 16 08/15/2020   ALKPHOS 88 08/15/2020   BILITOT 0.4 08/15/2020   Lab Results  Component Value Date   HGBA1C 5.6 08/15/2020   Lab Results  Component Value Date   INSULIN 9.3 08/15/2020   Lab Results  Component Value Date   TSH 1.170 08/15/2020   Lab Results  Component Value Date   CHOL 187 08/15/2020   HDL 54 08/15/2020   LDLCALC 114 (H) 08/15/2020   TRIG 107 08/15/2020   Lab Results  Component Value Date   WBC 4.7 08/15/2020   HGB 14.5 08/15/2020   HCT 43.9 08/15/2020   MCV 92 08/15/2020   PLT 219 08/15/2020    Attestation Statements:   Reviewed by clinician on day of visit: allergies, medications, problem list, medical history, surgical history, family history, social history, and previous encounter notes.  Edmund Hilda, am acting as transcriptionist for Reuben Likes, MD.   I have reviewed the above documentation for accuracy and completeness, and I agree with the above. - Katherina Mires, MD

## 2020-12-29 ENCOUNTER — Other Ambulatory Visit: Payer: Self-pay

## 2020-12-29 ENCOUNTER — Ambulatory Visit (INDEPENDENT_AMBULATORY_CARE_PROVIDER_SITE_OTHER): Payer: Managed Care, Other (non HMO) | Admitting: Family Medicine

## 2020-12-29 ENCOUNTER — Encounter (INDEPENDENT_AMBULATORY_CARE_PROVIDER_SITE_OTHER): Payer: Self-pay | Admitting: Family Medicine

## 2020-12-29 VITALS — BP 96/67 | HR 85 | Temp 98.0°F | Ht 68.0 in | Wt 194.0 lb

## 2020-12-29 DIAGNOSIS — E8881 Metabolic syndrome: Secondary | ICD-10-CM

## 2020-12-29 DIAGNOSIS — E669 Obesity, unspecified: Secondary | ICD-10-CM

## 2020-12-29 DIAGNOSIS — Z9189 Other specified personal risk factors, not elsewhere classified: Secondary | ICD-10-CM | POA: Diagnosis not present

## 2020-12-29 DIAGNOSIS — E88819 Insulin resistance, unspecified: Secondary | ICD-10-CM

## 2020-12-29 DIAGNOSIS — E559 Vitamin D deficiency, unspecified: Secondary | ICD-10-CM | POA: Diagnosis not present

## 2020-12-29 DIAGNOSIS — Z6830 Body mass index (BMI) 30.0-30.9, adult: Secondary | ICD-10-CM | POA: Diagnosis not present

## 2020-12-29 MED ORDER — METFORMIN HCL 500 MG PO TABS: 500.0000 mg | ORAL_TABLET | Freq: Every day | ORAL | 0 refills | Status: DC

## 2020-12-29 MED ORDER — VITAMIN D (ERGOCALCIFEROL) 1.25 MG (50000 UNIT) PO CAPS: 50000.0000 [IU] | ORAL_CAPSULE | ORAL | 0 refills | Status: DC

## 2021-01-03 NOTE — Progress Notes (Signed)
Chief Complaint:   OBESITY [name] is here to discuss her progress with her obesity treatment plan along with follow-up of her obesity related diagnoses. [name] is on the Category 2 Plan and states she is following her eating plan approximately 85-90% of the time. [name] states she is walking 20-25 minutes 2-3 times per week.  Today's visit was #: 9 Starting weight: 201 lbs Starting date: 08/15/2020 Today's weight: 194 lbs Today's date: 12/29/2020 Total lbs lost to date: 7 lbs Total lbs lost since last in-office visit: 0  Interim History: Kollins had to go out of town to help with a meeting and she has started walking, weather permitting. She is looking for more variety in terms of meal plan, throughout the day, not necessarily a particular meal. She doesn't necessarily have any plans coming up.  Subjective:   1. Insulin resistance [name]'s last A1c was 5.6 with an insulin level of 9.3. She ws started on Metformin after her last appointment and denies GI side effects.  Lab Results  Component Value Date   INSULIN 9.3 08/15/2020   Lab Results  Component Value Date   HGBA1C 5.6 08/15/2020    2. Vitamin D deficiency [name] denies nausea, vomiting, and muscle weakness but notes fatigue. Pt is on prescription Vit D.  3. At risk for osteoporosis [name] is at higher risk of osteopenia and osteoporosis due to Vitamin D deficiency.   Assessment/Plan:   1. Insulin resistance [name] will continue to work on weight loss, exercise, and decreasing simple carbohydrates to help decrease the risk of diabetes. [name] agreed to follow-up with [name] as directed to closely monitor her progress.  - metFORMIN (GLUCOPHAGE) 500 MG tablet; Take 1 tablet (500 mg total) by mouth daily with breakfast.  Dispense: 30 tablet; Refill: 0  2. Vitamin D deficiency Low Vitamin D level contributes to fatigue and are associated with obesity, breast, and colon cancer. She agrees to  continue to take prescription Vitamin D @50 ,000 IU every week and will follow-up for routine testing of Vitamin D, at least 2-3 times per year to avoid over-replacement.  - Vitamin D, Ergocalciferol, (DRISDOL) 1.25 MG (50000 UNIT) CAPS capsule; Take 1 capsule (50,000 Units total) by mouth every 7 (seven) days.  Dispense: 4 capsule; Refill: 0  3. At risk for osteoporosis [name] was given approximately 15 minutes of osteoporosis prevention counseling today. [name] is at risk for osteopenia and osteoporosis due to her Vitamin D deficiency. She was encouraged to take her Vitamin D and follow her higher calcium diet and increase strengthening exercise to help strengthen her bones and decrease her risk of osteopenia and osteoporosis.  Repetitive spaced learning was employed today to elicit superior memory formation and behavioral change.  4. Obesity with current BMI 29.6 [name] is currently in the action stage of change. As such, her goal is to continue with weight loss efforts. She has agreed to the Category 2 Plan and keeping a food journal and adhering to recommended goals of 400-500 calories and 35 + g  protein for supper.   Exercise goals: As is  Behavioral modification strategies: increasing lean protein intake, meal planning and cooking strategies, keeping healthy foods in the home and planning for success.  [name] has agreed to follow-up with our clinic in 2-3 weeks. She was informed of the importance of frequent follow-up visits to maximize her success with intensive lifestyle modifications for her multiple health conditions.   Objective:   Blood pressure 96/67, pulse 85, temperature 98  F (36.7 C), temperature source Oral, height 5\' 8"  (1.727 m), weight 194 lb (88 kg), SpO2 98 %. Body mass index is 29.5 kg/m.  General: Cooperative, alert, well developed, in no acute distress. HEENT: Conjunctivae and lids unremarkable. Cardiovascular: Regular rhythm.  Lungs: Normal work of  breathing. Neurologic: No focal deficits.   Lab Results  Component Value Date   CREATININE 0.88 08/15/2020   BUN 15 08/15/2020   NA 139 08/15/2020   K 4.3 08/15/2020   CL 104 08/15/2020   CO2 24 08/15/2020   Lab Results  Component Value Date   ALT 12 08/15/2020   AST 16 08/15/2020   ALKPHOS 88 08/15/2020   BILITOT 0.4 08/15/2020   Lab Results  Component Value Date   HGBA1C 5.6 08/15/2020   Lab Results  Component Value Date   INSULIN 9.3 08/15/2020   Lab Results  Component Value Date   TSH 1.170 08/15/2020   Lab Results  Component Value Date   CHOL 187 08/15/2020   HDL 54 08/15/2020   LDLCALC 114 (H) 08/15/2020   TRIG 107 08/15/2020   Lab Results  Component Value Date   WBC 4.7 08/15/2020   HGB 14.5 08/15/2020   HCT 43.9 08/15/2020   MCV 92 08/15/2020   PLT 219 08/15/2020   Attestation Statements:   Reviewed by clinician on day of visit: allergies, medications, problem list, medical history, surgical history, family history, social history, and previous encounter notes.  13/05/2020, am acting as transcriptionist for Edmund Hilda, MD.   I have reviewed the above documentation for accuracy and completeness, and I agree with the above. - Reuben Likes, MD

## 2021-01-18 ENCOUNTER — Ambulatory Visit (INDEPENDENT_AMBULATORY_CARE_PROVIDER_SITE_OTHER): Payer: Managed Care, Other (non HMO) | Admitting: Family Medicine

## 2021-02-06 ENCOUNTER — Other Ambulatory Visit: Payer: Self-pay

## 2021-02-06 ENCOUNTER — Encounter (INDEPENDENT_AMBULATORY_CARE_PROVIDER_SITE_OTHER): Payer: Self-pay | Admitting: Family Medicine

## 2021-02-06 ENCOUNTER — Ambulatory Visit (INDEPENDENT_AMBULATORY_CARE_PROVIDER_SITE_OTHER): Payer: Managed Care, Other (non HMO) | Admitting: Family Medicine

## 2021-02-06 VITALS — BP 105/66 | HR 88 | Temp 98.1°F | Ht 68.0 in | Wt 194.0 lb

## 2021-02-06 DIAGNOSIS — Z9189 Other specified personal risk factors, not elsewhere classified: Secondary | ICD-10-CM | POA: Diagnosis not present

## 2021-02-06 DIAGNOSIS — E559 Vitamin D deficiency, unspecified: Secondary | ICD-10-CM

## 2021-02-06 DIAGNOSIS — E669 Obesity, unspecified: Secondary | ICD-10-CM

## 2021-02-06 DIAGNOSIS — Z6830 Body mass index (BMI) 30.0-30.9, adult: Secondary | ICD-10-CM

## 2021-02-06 DIAGNOSIS — E8881 Metabolic syndrome: Secondary | ICD-10-CM | POA: Diagnosis not present

## 2021-02-06 MED ORDER — VITAMIN D (ERGOCALCIFEROL) 1.25 MG (50000 UNIT) PO CAPS: 50000.0000 [IU] | ORAL_CAPSULE | ORAL | 0 refills | Status: AC

## 2021-02-06 MED ORDER — METFORMIN HCL 500 MG PO TABS: 500.0000 mg | ORAL_TABLET | Freq: Every day | ORAL | 0 refills | Status: AC

## 2021-02-06 NOTE — Progress Notes (Signed)
kj

## 2021-02-07 NOTE — Progress Notes (Signed)
Chief Complaint:   OBESITY [name] is here to discuss her progress with her obesity treatment plan along with follow-up of her obesity related diagnoses. [name] is on the Category 2 Plan and keeping a food journal and adhering to recommended goals of 400-500 calories and 35 g protein and states she is following her eating plan approximately 80% of the time. [name] states she is walking 30 minutes 1 times per week.  Today's visit was #: 10 Starting weight: 201 lbs Starting date: 08/15/2020 Today's weight: 194 lbs Today's date: 02/06/2021 Total lbs lost to date: 7 Total lbs lost since last in-office visit: 0  Interim History: The last few weeks, pt hasn't been walking as much die to allergies. She felt she fell off the wagon on the meal plan. She has increased carb intake particularly of sweets. She got cookies for admin day, which she did try to decrease her access to. The next few weeks looks better in terms of ability to walk and more compliance to plan.  Subjective:   1. Insulin resistance Last A1c 5.6 and insulin level 9.3. Pt is on Metformin with no GI side effects.  2. Vitamin D deficiency Pt denies nausea, vomiting, and muscle weakness but notes fatigue. Pt is on prescription Vit D. Her Vit D level is 22.2.  3. At risk for diabetes mellitus [name] is at higher than average risk for developing diabetes due to obesity.   Assessment/Plan:   1. Insulin resistance Cadence will continue to work on weight loss, exercise, and decreasing simple carbohydrates to help decrease the risk of diabetes. [name] agreed to follow-up with [name] as directed to closely monitor her progress.  - metFORMIN (GLUCOPHAGE) 500 MG tablet; Take 1 tablet (500 mg total) by mouth daily with breakfast.  Dispense: 30 tablet; Refill: 0  2. Vitamin D deficiency Low Vitamin D level contributes to fatigue and are associated with obesity, breast, and colon cancer. She agrees to continue to take  prescription Vitamin D @50 ,000 IU every week and will follow-up for routine testing of Vitamin D, at least 2-3 times per year to avoid over-replacement.  - Vitamin D, Ergocalciferol, (DRISDOL) 1.25 MG (50000 UNIT) CAPS capsule; Take 1 capsule (50,000 Units total) by mouth every 7 (seven) days.  Dispense: 4 capsule; Refill: 0  3. At risk for diabetes mellitus [name] was given approximately 15 minutes of diabetes education and counseling today. We discussed intensive lifestyle modifications today with an emphasis on weight loss as well as increasing exercise and decreasing simple carbohydrates in her diet. We also reviewed medication options with an emphasis on risk versus benefit of those discussed.   Repetitive spaced learning was employed today to elicit superior memory formation and behavioral change.  4. Obesity with current BMI 29.6 [name] is currently in the action stage of change. As such, her goal is to continue with weight loss efforts. She has agreed to the Category 2 Plan and keeping a food journal and adhering to recommended goals of [phone] calories and 35+ grams protein with supper.   Exercise goals: As is  Behavioral modification strategies: increasing lean protein intake, meal planning and cooking strategies, keeping healthy foods in the home and planning for success.  [name] has agreed to follow-up with our clinic in 3 weeks. She was informed of the importance of frequent follow-up visits to maximize her success with intensive lifestyle modifications for her multiple health conditions.   Objective:   Blood pressure 105/66, pulse 88, temperature 98.1 F (  36.7 C), height 5\' 8"  (1.727 m), weight 194 lb (88 kg), SpO2 97 %. Body mass index is 29.5 kg/m.  General: Cooperative, alert, well developed, in no acute distress. HEENT: Conjunctivae and lids unremarkable. Cardiovascular: Regular rhythm.  Lungs: Normal work of breathing. Neurologic: No focal deficits.   Lab  Results  Component Value Date   CREATININE 0.88 08/15/2020   BUN 15 08/15/2020   NA 139 08/15/2020   K 4.3 08/15/2020   CL 104 08/15/2020   CO2 24 08/15/2020   Lab Results  Component Value Date   ALT 12 08/15/2020   AST 16 08/15/2020   ALKPHOS 88 08/15/2020   BILITOT 0.4 08/15/2020   Lab Results  Component Value Date   HGBA1C 5.6 08/15/2020   Lab Results  Component Value Date   INSULIN 9.3 08/15/2020   Lab Results  Component Value Date   TSH 1.170 08/15/2020   Lab Results  Component Value Date   CHOL 187 08/15/2020   HDL 54 08/15/2020   LDLCALC 114 (H) 08/15/2020   TRIG 107 08/15/2020   Lab Results  Component Value Date   WBC 4.7 08/15/2020   HGB 14.5 08/15/2020   HCT 43.9 08/15/2020   MCV 92 08/15/2020   PLT 219 08/15/2020     Attestation Statements:   Reviewed by clinician on day of visit: allergies, medications, problem list, medical history, surgical history, family history, social history, and previous encounter notes.  13/05/2020, am acting as transcriptionist for Tina Hilda, MD.   I have reviewed the above documentation for accuracy and completeness, and I agree with the above. - Reuben Likes, MD

## 2021-02-27 ENCOUNTER — Ambulatory Visit (INDEPENDENT_AMBULATORY_CARE_PROVIDER_SITE_OTHER): Payer: Managed Care, Other (non HMO) | Admitting: Family Medicine

## 2021-04-24 NOTE — Telephone Encounter (Signed)
Erroneous Encounter

## 2021-07-12 ENCOUNTER — Other Ambulatory Visit: Payer: Self-pay | Admitting: Family Medicine

## 2021-07-12 DIAGNOSIS — Z1231 Encounter for screening mammogram for malignant neoplasm of breast: Secondary | ICD-10-CM

## 2021-08-16 ENCOUNTER — Other Ambulatory Visit: Payer: Self-pay

## 2021-08-16 ENCOUNTER — Ambulatory Visit
Admission: RE | Admit: 2021-08-16 | Discharge: 2021-08-16 | Disposition: A | Discharge status: Home / Self Care | Payer: Managed Care, Other (non HMO) | Source: Ambulatory Visit | Attending: Family Medicine | Admitting: Family Medicine

## 2021-08-16 DIAGNOSIS — Z1231 Encounter for screening mammogram for malignant neoplasm of breast: Secondary | ICD-10-CM

## 2021-08-17 ENCOUNTER — Other Ambulatory Visit: Payer: Self-pay | Admitting: Family Medicine

## 2021-08-17 DIAGNOSIS — R928 Other abnormal and inconclusive findings on diagnostic imaging of breast: Secondary | ICD-10-CM

## 2021-09-15 ENCOUNTER — Ambulatory Visit
Admission: RE | Admit: 2021-09-15 | Discharge: 2021-09-15 | Disposition: A | Discharge status: Home / Self Care | Payer: Managed Care, Other (non HMO) | Source: Ambulatory Visit | Attending: Family Medicine | Admitting: Family Medicine

## 2021-09-15 ENCOUNTER — Other Ambulatory Visit: Payer: Self-pay

## 2021-09-15 DIAGNOSIS — R928 Other abnormal and inconclusive findings on diagnostic imaging of breast: Secondary | ICD-10-CM

## 2022-01-25 ENCOUNTER — Other Ambulatory Visit: Payer: Self-pay | Admitting: Family Medicine

## 2022-01-25 DIAGNOSIS — Z8041 Family history of malignant neoplasm of ovary: Secondary | ICD-10-CM

## 2022-01-26 IMAGING — MG MM DIGITAL SCREENING BILAT W/ TOMO AND CAD
8 series · 8 of 24 positions shown · non-contrast
Comparison: Previous exam(s).

CLINICAL DATA: Screening.

EXAM:
DIGITAL SCREENING BILATERAL MAMMOGRAM WITH TOMOSYNTHESIS AND CAD
TECHNIQUE: Bilateral screening digital craniocaudal and mediolateral oblique
mammograms were obtained. Bilateral screening digital breast
tomosynthesis was performed. The images were evaluated with
computer-aided detection.

[R MLO synth-2D]
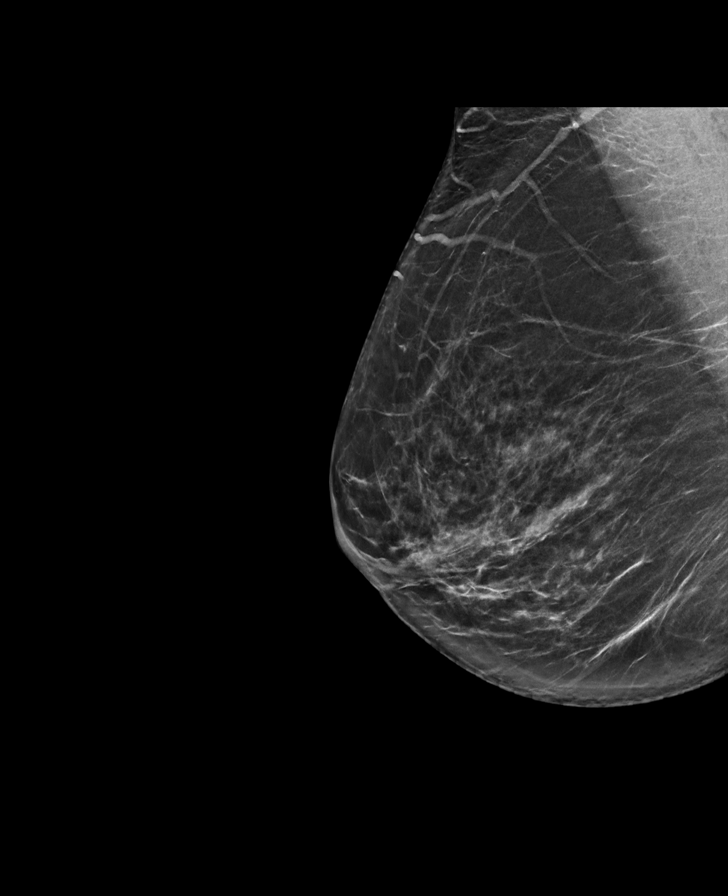

[R CC synth-2D]
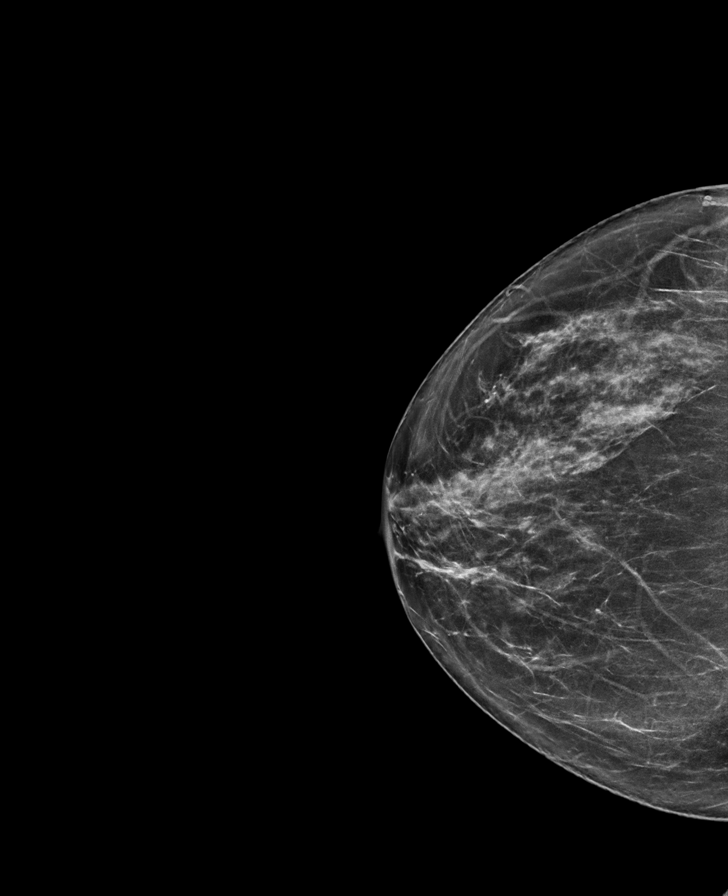

[L CC synth-2D]
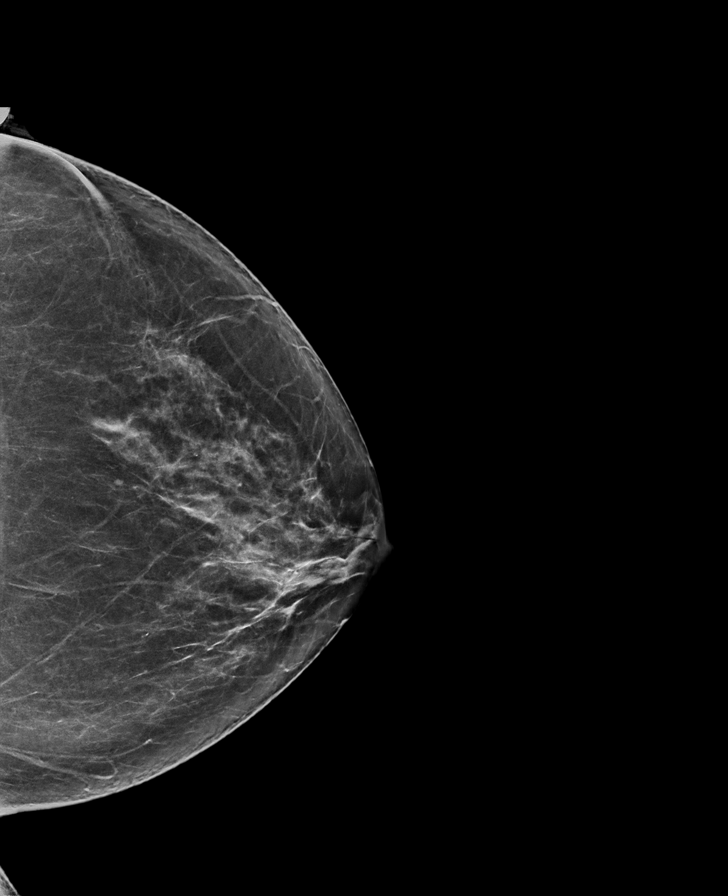

[L MLO synth-2D]
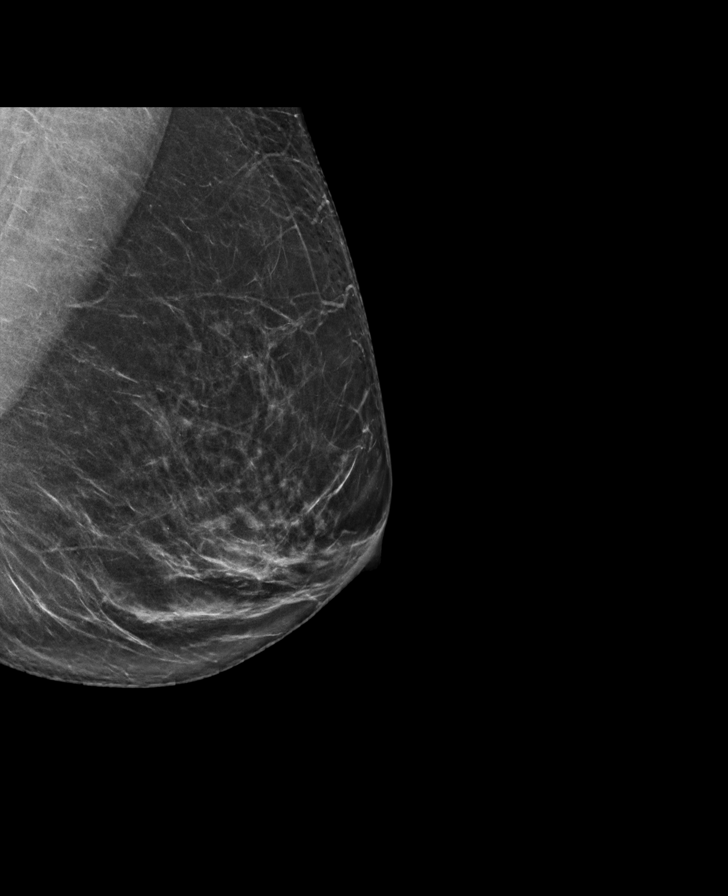

[L CC tomo · tomo slice 37/73.0]
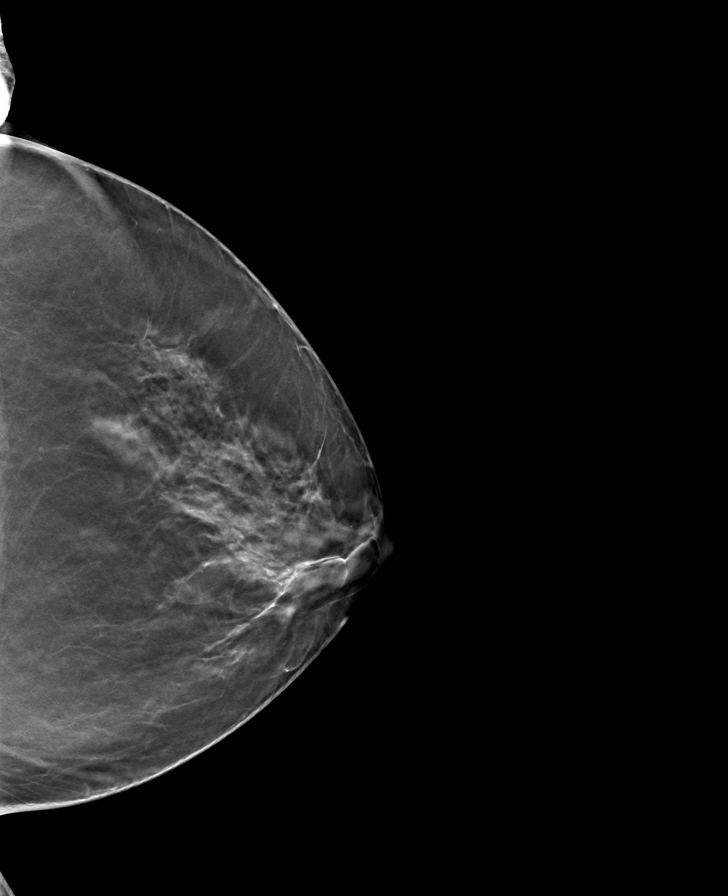

[R CC tomo · tomo slice 35/69.0]
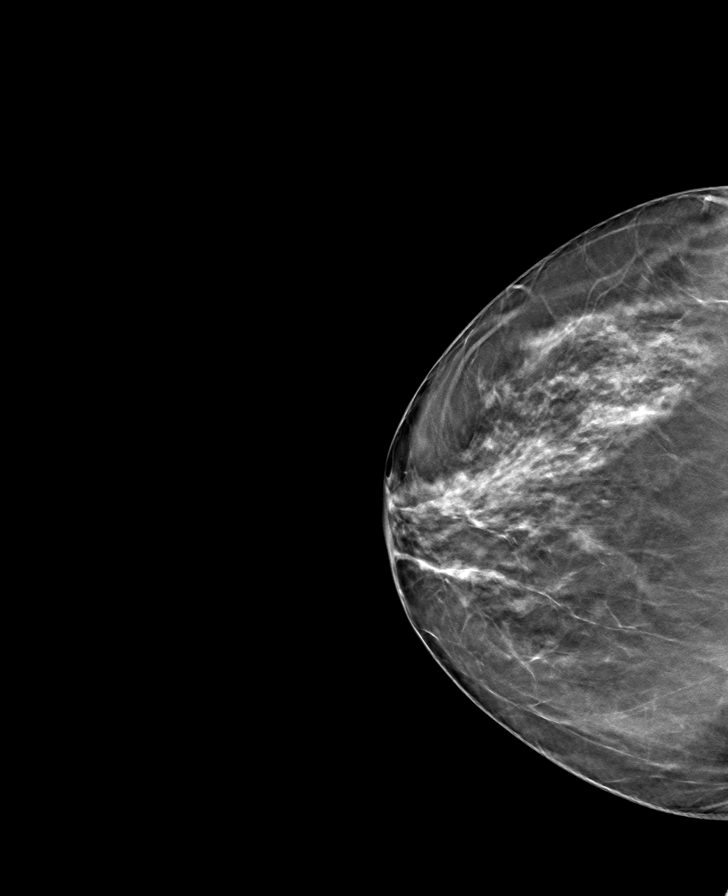

[R MLO tomo · tomo slice 37/74.0]
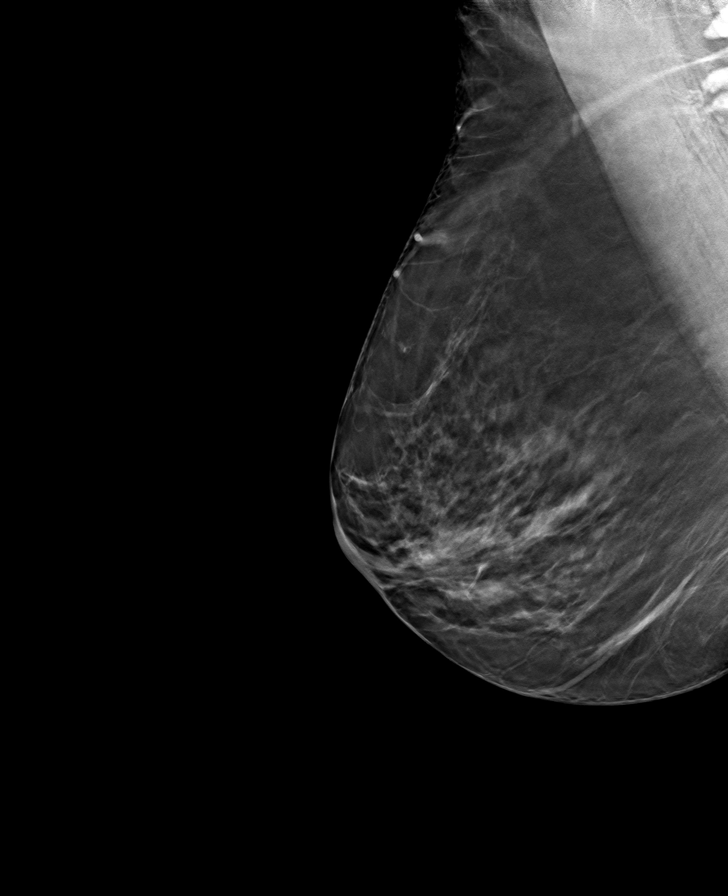

[L MLO tomo · tomo slice 37/72.0]
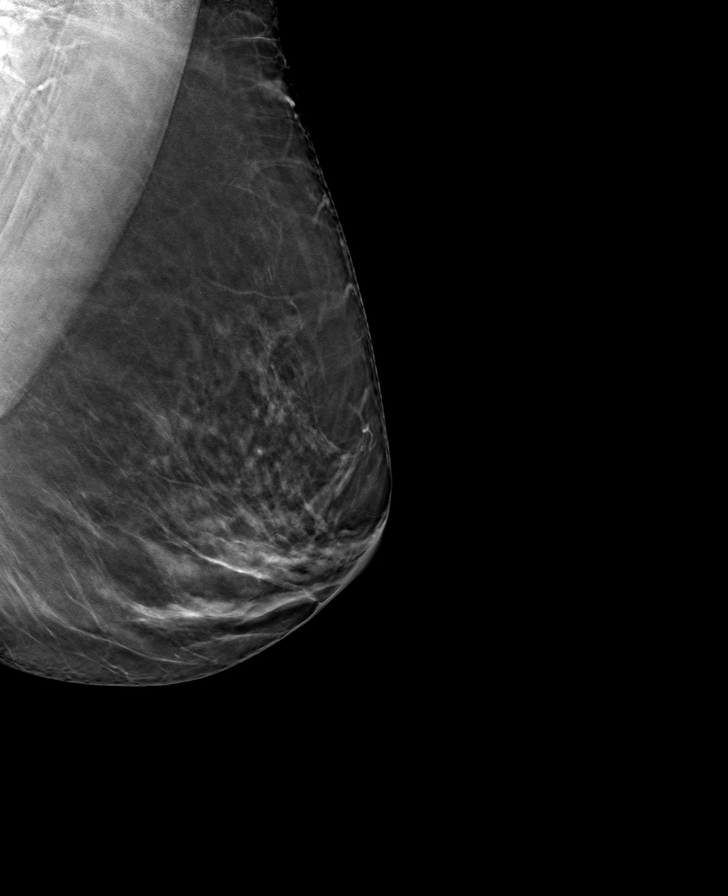

[8 of 24 positions shown; findings below may reference images not displayed]

ACR Breast Density Category b: There are scattered areas of
fibroglandular density.
FINDINGS: In the right breast, a possible mass warrants further evaluation. In
the left breast, no findings suspicious for malignancy.
IMPRESSION: Further evaluation is suggested for a possible mass in the right
breast.

RECOMMENDATION:
Diagnostic mammogram and possibly ultrasound of the right breast.
(Code:[id number])

The patient will be contacted regarding the findings, and additional
imaging will be scheduled.

BI-RADS CATEGORY  0: Incomplete. Need additional imaging evaluation
and/or prior mammograms for comparison.

## 2022-01-29 ENCOUNTER — Ambulatory Visit
Admission: RE | Admit: 2022-01-29 | Discharge: 2022-01-29 | Disposition: A | Discharge status: Home / Self Care | Payer: Managed Care, Other (non HMO) | Source: Ambulatory Visit | Attending: Family Medicine | Admitting: Family Medicine

## 2022-01-29 DIAGNOSIS — Z8041 Family history of malignant neoplasm of ovary: Secondary | ICD-10-CM

## 2022-05-16 ENCOUNTER — Encounter (INDEPENDENT_AMBULATORY_CARE_PROVIDER_SITE_OTHER): Payer: Self-pay

## 2022-11-02 ENCOUNTER — Other Ambulatory Visit: Payer: Self-pay | Admitting: Family Medicine

## 2022-11-02 DIAGNOSIS — Z1231 Encounter for screening mammogram for malignant neoplasm of breast: Secondary | ICD-10-CM

## 2022-12-20 ENCOUNTER — Ambulatory Visit
Admission: RE | Admit: 2022-12-20 | Discharge: 2022-12-20 | Disposition: A | Discharge status: Home / Self Care | Payer: Managed Care, Other (non HMO) | Source: Ambulatory Visit | Attending: Family Medicine | Admitting: Family Medicine

## 2022-12-20 DIAGNOSIS — Z1231 Encounter for screening mammogram for malignant neoplasm of breast: Secondary | ICD-10-CM

## 2023-01-30 ENCOUNTER — Other Ambulatory Visit: Payer: Self-pay | Admitting: Family Medicine

## 2023-01-30 DIAGNOSIS — Z8041 Family history of malignant neoplasm of ovary: Secondary | ICD-10-CM

## 2023-02-27 ENCOUNTER — Ambulatory Visit
Admission: RE | Admit: 2023-02-27 | Discharge: 2023-02-27 | Disposition: A | Discharge status: Home / Self Care | Payer: Managed Care, Other (non HMO) | Source: Ambulatory Visit | Attending: Family Medicine | Admitting: Family Medicine

## 2023-02-27 DIAGNOSIS — Z8041 Family history of malignant neoplasm of ovary: Secondary | ICD-10-CM

## 2024-02-10 ENCOUNTER — Other Ambulatory Visit: Payer: Self-pay | Admitting: Family Medicine

## 2024-02-10 DIAGNOSIS — Z1231 Encounter for screening mammogram for malignant neoplasm of breast: Secondary | ICD-10-CM

## 2024-02-12 ENCOUNTER — Encounter (HOSPITAL_COMMUNITY): Payer: Self-pay

## 2024-02-17 ENCOUNTER — Other Ambulatory Visit: Payer: Self-pay | Admitting: Family Medicine

## 2024-02-17 DIAGNOSIS — Z8041 Family history of malignant neoplasm of ovary: Secondary | ICD-10-CM

## 2024-02-19 ENCOUNTER — Ambulatory Visit
Admission: RE | Admit: 2024-02-19 | Discharge: 2024-02-19 | Disposition: A | Discharge status: Home / Self Care | Source: Ambulatory Visit | Attending: Family Medicine | Admitting: Family Medicine

## 2024-02-19 DIAGNOSIS — Z1231 Encounter for screening mammogram for malignant neoplasm of breast: Secondary | ICD-10-CM

## 2024-03-09 ENCOUNTER — Ambulatory Visit
Admission: RE | Admit: 2024-03-09 | Discharge: 2024-03-09 | Disposition: A | Discharge status: Home / Self Care | Source: Ambulatory Visit | Attending: Family Medicine | Admitting: Family Medicine

## 2024-03-09 DIAGNOSIS — Z8041 Family history of malignant neoplasm of ovary: Secondary | ICD-10-CM
